# Patient Record
Sex: Female | Born: 1995
Health system: Southern US, Community
[De-identification: ages and names within clinical notes are randomized; demographics above are authoritative.]

## PROBLEM LIST (undated history)

## (undated) DIAGNOSIS — T7840XA Allergy, unspecified, initial encounter: Secondary | ICD-10-CM

## (undated) DIAGNOSIS — D682 Hereditary deficiency of other clotting factors: Secondary | ICD-10-CM

## (undated) DIAGNOSIS — D689 Coagulation defect, unspecified: Secondary | ICD-10-CM

## (undated) DIAGNOSIS — D6851 Activated protein C resistance: Secondary | ICD-10-CM

## (undated) HISTORY — PX: EYE SURGERY: SHX253

## (undated) HISTORY — DX: Allergy, unspecified, initial encounter: T78.40XA

## (undated) HISTORY — DX: Coagulation defect, unspecified: D68.9

---
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
## 2014-08-19 DIAGNOSIS — Z832 Family history of diseases of the blood and blood-forming organs and certain disorders involving the immune mechanism: Secondary | ICD-10-CM | POA: Insufficient documentation

## 2014-08-19 DIAGNOSIS — Z8249 Family history of ischemic heart disease and other diseases of the circulatory system: Secondary | ICD-10-CM | POA: Insufficient documentation

## 2014-09-14 DIAGNOSIS — D6851 Activated protein C resistance: Secondary | ICD-10-CM | POA: Insufficient documentation

## 2015-09-07 ENCOUNTER — Other Ambulatory Visit: Payer: Self-pay | Admitting: Pediatrics

## 2015-09-07 DIAGNOSIS — D179 Benign lipomatous neoplasm, unspecified: Secondary | ICD-10-CM

## 2015-09-09 ENCOUNTER — Other Ambulatory Visit: Payer: Self-pay

## 2015-09-14 ENCOUNTER — Ambulatory Visit
Admission: RE | Admit: 2015-09-14 | Discharge: 2015-09-14 | Disposition: A | Discharge status: Home / Self Care | Payer: BLUE CROSS/BLUE SHIELD | Source: Ambulatory Visit | Attending: Pediatrics | Admitting: Pediatrics

## 2015-09-14 DIAGNOSIS — D179 Benign lipomatous neoplasm, unspecified: Secondary | ICD-10-CM | POA: Diagnosis present

## 2015-09-14 DIAGNOSIS — D171 Benign lipomatous neoplasm of skin and subcutaneous tissue of trunk: Secondary | ICD-10-CM | POA: Insufficient documentation

## 2015-12-10 ENCOUNTER — Other Ambulatory Visit: Payer: Self-pay | Admitting: Pediatrics

## 2015-12-10 ENCOUNTER — Ambulatory Visit
Admission: RE | Admit: 2015-12-10 | Discharge: 2015-12-10 | Disposition: A | Discharge status: Home / Self Care | Payer: BLUE CROSS/BLUE SHIELD | Source: Ambulatory Visit | Attending: Pediatrics | Admitting: Pediatrics

## 2015-12-10 ENCOUNTER — Other Ambulatory Visit
Admission: RE | Admit: 2015-12-10 | Discharge: 2015-12-10 | Disposition: A | Discharge status: Home / Self Care | Payer: BLUE CROSS/BLUE SHIELD | Source: Ambulatory Visit | Attending: Pediatrics | Admitting: Pediatrics

## 2015-12-10 DIAGNOSIS — G8929 Other chronic pain: Secondary | ICD-10-CM | POA: Diagnosis not present

## 2015-12-10 DIAGNOSIS — M25561 Pain in right knee: Secondary | ICD-10-CM | POA: Diagnosis present

## 2015-12-10 LAB — PREGNANCY, URINE: Preg Test, Ur: NEGATIVE

## 2018-11-06 DIAGNOSIS — D689 Coagulation defect, unspecified: Secondary | ICD-10-CM | POA: Insufficient documentation

## 2020-02-05 ENCOUNTER — Ambulatory Visit: Payer: Self-pay | Attending: Internal Medicine

## 2020-02-05 DIAGNOSIS — Z23 Encounter for immunization: Secondary | ICD-10-CM

## 2020-02-05 NOTE — Progress Notes (Signed)
   Covid-19 Vaccination Clinic  Name:  [name]    MRN: [id number] DOB: Feb 09, 1996  02/05/2020  [name] was observed post Covid-19 immunization for 15 minutes without incident. She was provided with Vaccine Information Sheet and instruction to access the V-Safe system.   [name] was instructed to call 911 with any severe reactions post vaccine: Marland Kitchen Difficulty breathing  . Swelling of face and throat  . A fast heartbeat  . A bad rash all over body  . Dizziness and weakness   Immunizations Administered    Name Date Dose VIS Date Route   Moderna COVID-19 Vaccine 02/05/2020  9:50 AM 0.5 mL 10/20/2019 Intramuscular   Manufacturer: Moderna   Lot: [id number]   [id number]

## 2020-03-08 ENCOUNTER — Ambulatory Visit: Payer: Self-pay | Attending: Internal Medicine

## 2020-03-08 DIAGNOSIS — Z23 Encounter for immunization: Secondary | ICD-10-CM

## 2020-03-08 NOTE — Progress Notes (Signed)
   Covid-19 Vaccination Clinic  Name:  Monica Singleton    MRN: [id number] DOB: [birth year]  03/08/2020  Monica Singleton was observed post Covid-19 immunization for 15 minutes .  During the observation period, she experienced an adverse reaction with the following symptoms:  felt flushed, "like I'm about to pass out", pale colored..  Assessment : Time of assessment 0920. Alert and oriented, Anxious, Diaphoretic and Clammy. After sitting for 5 minutes of a 15 minute observation, patient reported feeling like she will pass out. She was flushed, felt hot, color pale, clammy. She was placed on the floor and she says she was starting to feel better. She did not pass out. 0925: BP 108/68 lying on the floor, P 52, 97% oxygen saturation RA. She reported feeling better. [id number]: She was assisted to the wheelchair and taken to the triage area. She was assisted to the stretcher. 0931: BP 116/65, P 51  Actions taken:  [id number]: Vitals sign taken  VAERS form obtained and completed by Janan Ridge, RN. Standing BP 131/83 P 64 R 16, Oxgen 100% RA, ambulated around with no symptoms.  There were no vitals filed for this visit.  Medications administered: No medication administered.  Disposition: Reports no further symptoms of adverse reaction after observation for 25 minutes. Discharged home. Instructed to call 911 for trouble breathing, rapid heart rate, dizziness, swelling of tongue or throat. The Patient was provided with Vaccine Information Sheet and instruction to access the V-Safe system.    Immunizations Administered    Name Date Dose VIS Date Route   Moderna COVID-19 Vaccine 03/08/2020  9:16 AM 0.5 mL 10/2019 Intramuscular   Manufacturer: Moderna   Lot: [id number]   [id number]

## 2020-03-10 ENCOUNTER — Telehealth: Payer: Self-pay | Admitting: *Deleted

## 2020-03-10 NOTE — Telephone Encounter (Signed)
Called and spoke with the patient regarding her second COVID vaccine that she received on 03/08/2020 and she stated that evening she was running a fever and had body aches, she stated other than that she felt fine and had no other issues. Advised to patient that it did not seem like any of her symptoms represented an allergic reaction and that she should be fine to receive future COVID boosters and to please call our office with any questions. Patient verbalized understanding.

## 2020-03-19 HISTORY — PX: WISDOM TOOTH EXTRACTION: SHX21

## 2020-07-26 ENCOUNTER — Ambulatory Visit
Admission: EM | Admit: 2020-07-26 | Discharge: 2020-07-26 | Disposition: A | Discharge status: Home / Self Care | Payer: BC Managed Care – PPO | Attending: Emergency Medicine | Admitting: Emergency Medicine

## 2020-07-26 DIAGNOSIS — R21 Rash and other nonspecific skin eruption: Secondary | ICD-10-CM

## 2020-07-26 HISTORY — DX: Hereditary deficiency of other clotting factors: D68.2

## 2020-07-26 HISTORY — DX: Activated protein C resistance: D68.51

## 2020-07-26 MED ORDER — DOXYCYCLINE HYCLATE 100 MG PO CAPS: 100.0000 mg | ORAL_CAPSULE | Freq: Two times a day (BID) | ORAL | 0 refills | Status: DC

## 2020-07-26 MED ORDER — PREDNISONE 10 MG (21) PO TBPK: ORAL_TABLET | Freq: Every day | ORAL | 0 refills | Status: DC

## 2020-07-26 NOTE — Discharge Instructions (Addendum)
Your lab work for Lyme disease and [facility] Spotted Fever is pending.    Take the doxycycline and prednisone as directed.  Take Benadryl as directed; do not drive, operate machinery, or drink alcohol taking Benadryl as it may cause drowsiness.    Follow up with your primary care provider if your symptoms are not improving.

## 2020-07-26 NOTE — ED Provider Notes (Signed)
Monica Singleton    CSN: [id number] Arrival date & time: 07/26/20  1621      History   Chief Complaint Chief Complaint  Patient presents with  . Rash  . Pruritis    HPI Monica Singleton is a 24 y.o. female.   Patient presents with rash on lower extremities x2.5 weeks.  She is concerned that she may have had a tick bite although she has not seen one.  She denies fever, chills, sore throat, cough, SOB, vomiting, diarrhea, or other symptoms.  Treatment attempted at home itch medication.  The history is provided by the patient.    Past Medical History:  Diagnosis Date  . 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 Factor V Leiden ([place])     There are no problems to display for this patient.   Past Surgical History:  Procedure Laterality Date  . WISDOM TOOTH EXTRACTION  03/2020    OB History   No obstetric history on file.      Home Medications    Prior to Admission medications   Medication Sig Start Date End Date Taking? Authorizing Provider  doxycycline (VIBRAMYCIN) 100 MG capsule Take 1 capsule (100 mg total) by mouth 2 (two) times daily. 07/26/20   Sharion Balloon, NP  predniSONE (STERAPRED UNI-PAK 21 TAB) 10 MG (21) TBPK tablet Take by mouth daily. As directed 07/26/20   Sharion Balloon, NP    Family History Family History  Problem Relation Age of Onset  . Factor V Leiden deficiency Father     Social History Social History   Tobacco Use  . Smoking status: Never Smoker  . Smokeless tobacco: Never Used  Vaping Use  . Vaping Use: Never used  Substance Use Topics  . Alcohol use: Yes    Comment: occasionally  . Drug use: Not on file     Allergies   Nickel   Review of Systems Review of Systems  Constitutional: Negative for chills and fever.  HENT: Negative for ear pain and sore throat.   Eyes: Negative for pain and visual disturbance.  Respiratory: Negative for cough and shortness of breath.   Cardiovascular: Negative for chest pain and palpitations.   Gastrointestinal: Negative for abdominal pain and vomiting.  Genitourinary: Negative for dysuria and hematuria.  Musculoskeletal: Negative for arthralgias and back pain.  Skin: Positive for rash. Negative for color change.  Neurological: Negative for seizures, syncope, weakness and numbness.  All other systems reviewed and are negative.    Physical Exam Triage Vital Signs ED Triage Vitals  Enc Vitals Group     BP 07/26/20 1711 (!) 149/90     Pulse Rate 07/26/20 1711 90     Resp 07/26/20 1711 16     Temp 07/26/20 1711 98.1 F (36.7 C)     Temp src --      SpO2 07/26/20 1711 99 %     Weight --      Height --      Head Circumference --      Peak Flow --      Pain Score 07/26/20 1706 4     Pain Loc --      Pain Edu? --      Excl. in [place]? --    No data found.  Updated Vital Signs BP (!) 149/90   Pulse 90   Temp 98.1 F (36.7 C)   Resp 16   SpO2 99%   Visual Acuity Right Eye Distance:   Left  Eye Distance:   Bilateral Distance:    Right Eye Near:   Left Eye Near:    Bilateral Near:     Physical Exam Vitals and nursing note reviewed.  Constitutional:      General: She is not in acute distress.    Appearance: She is well-developed. She is not ill-appearing.  HENT:     Head: Normocephalic and atraumatic.     Mouth/Throat:     Mouth: Mucous membranes are moist.  Eyes:     Conjunctiva/sclera: Conjunctivae normal.  Cardiovascular:     Rate and Rhythm: Normal rate and regular rhythm.     Heart sounds: No murmur heard.   Pulmonary:     Effort: Pulmonary effort is normal. No respiratory distress.     Breath sounds: Normal breath sounds.  Abdominal:     Palpations: Abdomen is soft.     Tenderness: There is no abdominal tenderness.  Musculoskeletal:        General: Normal range of motion.     Cervical back: Neck supple.  Skin:    General: Skin is warm and dry.     Findings: Rash present.     Comments: Rash on LE.  See pictures for details.    Neurological:      General: No focal deficit present.     Mental Status: She is alert and oriented to person, place, and time.     Gait: Gait normal.  Psychiatric:        Mood and Affect: Mood normal.        Behavior: Behavior normal.            UC Treatments / Results  Labs (all labs ordered are listed, but only abnormal results are displayed) Labs Reviewed  B. BURGDORFI ANTIBODIES  ROCKY MTN SPOTTED FVR ABS PNL(IGG+IGM)    EKG   Radiology No results found.  Procedures Procedures (including critical care time)  Medications Ordered in UC Medications - No data to display  Initial Impression / Assessment and Plan / UC Course  I have reviewed the triage vital signs and the nursing notes.  Pertinent labs & imaging results that were available during my care of the patient were reviewed by me and considered in my medical decision making (see chart for details).   Rash on LE.  Lyme and RMSF pending.  Treating with doxycycline, prednisone, and Benadryl.  Precautions for drowsiness with Benadryl discussed with patient.  Instructed her to follow-up primary care provider if her symptoms are not improving.  Patient agrees to plan of care.   Final Clinical Impressions(s) / UC Diagnoses   Final diagnoses:  Rash and nonspecific skin eruption     Discharge Instructions     Your lab work for Lyme disease and [facility] Spotted Fever is pending.    Take the doxycycline and prednisone as directed.  Take Benadryl as directed; do not drive, operate machinery, or drink alcohol taking Benadryl as it may cause drowsiness.    Follow up with your primary care provider if your symptoms are not improving.       ED Prescriptions    Medication Sig Dispense Auth. Provider   doxycycline (VIBRAMYCIN) 100 MG capsule Take 1 capsule (100 mg total) by mouth 2 (two) times daily. 20 capsule Sharion Balloon, NP   predniSONE (STERAPRED UNI-PAK 21 TAB) 10 MG (21) TBPK tablet Take by mouth daily. As  directed 21 tablet Sharion Balloon, NP     PDMP not reviewed  this encounter.   Sharion Balloon, NP 07/26/20 1750

## 2020-07-26 NOTE — ED Triage Notes (Signed)
Patient reports rash on bilateral lower extremities that is beginning to spread. Reports it has been 2.5 weeks. Also concerned one of the areas may be a tick bite.

## 2020-07-27 ENCOUNTER — Ambulatory Visit: Payer: Self-pay

## 2020-07-28 LAB — ROCKY MTN SPOTTED FVR ABS PNL(IGG+IGM)
RMSF IgG: NEGATIVE
RMSF IgM: 1.52 index — ABNORMAL HIGH (ref 0.00–0.89)

## 2020-07-28 LAB — B. BURGDORFI ANTIBODIES: Lyme IgG/IgM Ab: <0.91 {ISR} (ref 0.00–0.90)

## 2021-01-18 ENCOUNTER — Ambulatory Visit: Payer: BC Managed Care – PPO | Admitting: Adult Health

## 2021-02-01 ENCOUNTER — Other Ambulatory Visit: Payer: Self-pay

## 2021-02-01 ENCOUNTER — Ambulatory Visit (INDEPENDENT_AMBULATORY_CARE_PROVIDER_SITE_OTHER): Payer: BC Managed Care – PPO | Admitting: Adult Health

## 2021-02-01 ENCOUNTER — Encounter: Payer: Self-pay | Admitting: Adult Health

## 2021-02-01 VITALS — BP 126/68 | HR 56 | Temp 97.7°F | Resp 15 | Ht 65.0 in | Wt 141.4 lb

## 2021-02-01 DIAGNOSIS — Z7901 Long term (current) use of anticoagulants: Secondary | ICD-10-CM | POA: Insufficient documentation

## 2021-02-01 DIAGNOSIS — D6859 Other primary thrombophilia: Secondary | ICD-10-CM

## 2021-02-01 DIAGNOSIS — D682 Hereditary deficiency of other clotting factors: Secondary | ICD-10-CM | POA: Insufficient documentation

## 2021-02-01 DIAGNOSIS — D6852 Prothrombin gene mutation: Secondary | ICD-10-CM | POA: Diagnosis not present

## 2021-02-01 DIAGNOSIS — Z1389 Encounter for screening for other disorder: Secondary | ICD-10-CM

## 2021-02-01 DIAGNOSIS — K582 Mixed irritable bowel syndrome: Secondary | ICD-10-CM | POA: Insufficient documentation

## 2021-02-01 DIAGNOSIS — D6851 Activated protein C resistance: Secondary | ICD-10-CM

## 2021-02-01 LAB — POCT URINALYSIS DIPSTICK
Bilirubin, UA: NEGATIVE
Blood, UA: NEGATIVE
Glucose, UA: NEGATIVE
Ketones, UA: NEGATIVE
Leukocytes, UA: NEGATIVE
Nitrite, UA: NEGATIVE
Protein, UA: NEGATIVE
Spec Grav, UA: <=1.005 — AB (ref 1.010–1.025)
Urobilinogen, UA: 0.2 E.U./dL
pH, UA: 7 (ref 5.0–8.0)

## 2021-02-01 NOTE — Patient Instructions (Addendum)
Debrox over the counter use for 2 weeks.,  Health Maintenance, Female Adopting a healthy lifestyle and getting preventive care are important in promoting health and wellness. Ask your health care provider about:  The right schedule for you to have regular tests and exams.  Things you can do on your own to prevent diseases and keep yourself healthy. What should I know about diet, weight, and exercise? Eat a healthy diet  Eat a diet that includes plenty of vegetables, fruits, low-fat dairy products, and lean protein.  Do not eat a lot of foods that are high in solid fats, added sugars, or sodium.   Maintain a healthy weight Body mass index (BMI) is used to identify weight problems. It estimates body fat based on height and weight. Your health care provider can help determine your BMI and help you achieve or maintain a healthy weight. Get regular exercise Get regular exercise. This is one of the most important things you can do for your health. Most adults should:  Exercise for at least 150 minutes each week. The exercise should increase your heart rate and make you sweat (moderate-intensity exercise).  Do strengthening exercises at least twice a week. This is in addition to the moderate-intensity exercise.  Spend less time sitting. Even light physical activity can be beneficial. Watch cholesterol and blood lipids Have your blood tested for lipids and cholesterol at 26 years of age, then have this test every 5 years. Have your cholesterol levels checked more often if:  Your lipid or cholesterol levels are high.  You are older than 26 years of age.  You are at high risk for heart disease. What should I know about cancer screening? Depending on your health history and family history, you may need to have cancer screening at various ages. This may include screening for:  Breast cancer.  Cervical cancer.  Colorectal cancer.  Skin cancer.  Lung cancer. What should I know about  heart disease, diabetes, and high blood pressure? Blood pressure and heart disease  High blood pressure causes heart disease and increases the risk of stroke. This is more likely to develop in people who have high blood pressure readings, are of African descent, or are overweight.  Have your blood pressure checked: ? Every 3-5 years if you are 78-56 years of age. ? Every year if you are 42 years old or older. Diabetes Have regular diabetes screenings. This checks your fasting blood sugar level. Have the screening done:  Once every three years after age 98 if you are at a normal weight and have a low risk for diabetes.  More often and at a younger age if you are overweight or have a high risk for diabetes. What should I know about preventing infection? Hepatitis B If you have a higher risk for hepatitis B, you should be screened for this virus. Talk with your health care provider to find out if you are at risk for hepatitis B infection. Hepatitis C Testing is recommended for:  Everyone born from [birth year] through 1966.  Anyone with known risk factors for hepatitis C. Sexually transmitted infections (STIs)  Get screened for STIs, including gonorrhea and chlamydia, if: ? You are sexually active and are younger than 26 years of age. ? You are older than 26 years of age and your health care provider tells you that you are at risk for this type of infection. ? Your sexual activity has changed since you were last screened, and you are at increased risk  for chlamydia or gonorrhea. Ask your health care provider if you are at risk.  Ask your health care provider about whether you are at high risk for HIV. Your health care provider may recommend a prescription medicine to help prevent HIV infection. If you choose to take medicine to prevent HIV, you should first get tested for HIV. You should then be tested every 3 months for as long as you are taking the medicine. Pregnancy  If you are about to  stop having your period (premenopausal) and you may become pregnant, seek counseling before you get pregnant.  Take 400 to 800 micrograms (mcg) of folic acid every day if you become pregnant.  Ask for birth control (contraception) if you want to prevent pregnancy. Osteoporosis and menopause Osteoporosis is a disease in which the bones lose minerals and strength with aging. This can result in bone fractures. If you are 92 years old or older, or if you are at risk for osteoporosis and fractures, ask your health care provider if you should:  Be screened for bone loss.  Take a calcium or vitamin D supplement to lower your risk of fractures.  Be given hormone replacement therapy (HRT) to treat symptoms of menopause. Follow these instructions at home: Lifestyle  Do not use any products that contain nicotine or tobacco, such as cigarettes, e-cigarettes, and chewing tobacco. If you need help quitting, ask your health care provider.  Do not use street drugs.  Do not share needles.  Ask your health care provider for help if you need support or information about quitting drugs. Alcohol use  Do not drink alcohol if: ? Your health care provider tells you not to drink. ? You are pregnant, may be pregnant, or are planning to become pregnant.  If you drink alcohol: ? Limit how much you use to 0-1 drink a day. ? Limit intake if you are breastfeeding.  Be aware of how much alcohol is in your drink. In the [place] one drink equals one 12 oz bottle of beer (355 mL), one 5 oz glass of wine (148 mL), or one 1 oz glass of hard liquor (44 mL). General instructions  Schedule regular health, dental, and eye exams.  Stay current with your vaccines.  Tell your health care provider if: ? You often feel depressed. ? You have ever been abused or do not feel safe at home. Summary  Adopting a healthy lifestyle and getting preventive care are important in promoting health and wellness.  Follow your  health care provider's instructions about healthy diet, exercising, and getting tested or screened for diseases.  Follow your health care provider's instructions on monitoring your cholesterol and blood pressure. This information is not intended to replace advice given to you by your health care provider. Make sure you discuss any questions you have with your health care provider. Document Revised: 10/29/2018 Document Reviewed: 10/29/2018 Elsevier Patient Education  2021 [place] [employer].     Psyllium granules or powder for solution What is this medicine? PSYLLIUM (SIL i yum) is a bulk-forming fiber laxative. This medicine is used to treat constipation. Increasing fiber in the diet may also help lower cholesterol and promote heart health for some people. This medicine may be used for other purposes; ask your health care provider or pharmacist if you have questions. COMMON BRAND NAME(S): Fiber Therapy, GenFiber, Geri-Mucil, Hydrocil, Konsyl, Metamucil, Metamucil MultiHealth, Mucilin, Natural Fiber Therapy, Reguloid What should I tell my health care provider before I take this medicine? They need to  know if you have any of these conditions:  blockage in your bowel  difficulty swallowing  inflammatory bowel disease  phenylketonuria  stomach or intestine problems  sudden change in bowel habits lasting more than 2 weeks  an unusual or allergic reaction to psyllium, other medicines, dyes, or preservatives  pregnant or trying or get pregnant  breast-feeding How should I use this medicine? Mix this medicine into a full glass (240 mL) of water or other cool drink. Take this medicine by mouth. Follow the directions on the package labeling, or take as directed by your health care professional. Take your medicine at regular intervals. Do not take your medicine more often than directed. Talk to your pediatrician regarding the use of this medicine in children. While this drug may be prescribed  for children as young as 24 years old for selected conditions, precautions do apply. Overdosage: If you think you have taken too much of this medicine contact a poison control center or emergency room at once. NOTE: This medicine is only for you. Do not share this medicine with others. What if I miss a dose? If you miss a dose, take it as soon as you can. If it is almost time for your next dose, take only that dose. Do not take double or extra doses. What may interact with this medicine? Interactions are not expected. Take this product at least 2 hours before or after other medicines. This list may not describe all possible interactions. Give your health care provider a list of all the medicines, herbs, non-prescription drugs, or dietary supplements you use. Also tell them if you smoke, drink alcohol, or use illegal drugs. Some items may interact with your medicine. What should I watch for while using this medicine? Check with your doctor or health care professional if your symptoms do not start to get better or if they get worse. Stop using this medicine and contact your doctor or health care professional if you have rectal bleeding or if you have to treat your constipation for more than 1 week. These could be signs of a more serious condition. Drink several glasses of water a day while you are taking this medicine. This will help to relieve constipation and prevent dehydration. What side effects may I notice from receiving this medicine? Side effects that you should report to your doctor or health care professional as soon as possible:  allergic reactions like skin rash, itching or hives, swelling of the face, lips, or tongue  breathing problems  chest pain  nausea, vomiting  rectal bleeding  trouble swallowing Side effects that usually do not require medical attention (report to your doctor or health care professional if they continue or are bothersome):  bloating  gas  stomach  cramps This list may not describe all possible side effects. Call your doctor for medical advice about side effects. You may report side effects to FDA at 1-800-FDA-1088. Where should I keep my medicine? Keep out of the reach of children. Store at room temperature between 15 and 30 degrees C (59 and 86 degrees F). Protect from moisture. Throw away any unused medicine after the expiration date. NOTE: This sheet is a summary. It may not cover all possible information. If you have questions about this medicine, talk to your doctor, pharmacist, or health care provider.  2021 Elsevier/Gold Standard (2018-04-01 15:41:08)   Earwax Buildup, Adult The ears produce a substance called earwax that helps keep bacteria out of the ear and protects the skin in  the ear canal. Occasionally, earwax can build up in the ear and cause discomfort or hearing loss. What are the causes? This condition is caused by a buildup of earwax. Ear canals are self-cleaning. Ear wax is made in the outer part of the ear canal and generally falls out in small amounts over time. When the self-cleaning mechanism is not working, earwax builds up and can cause decreased hearing and discomfort. Attempting to clean ears with cotton swabs can push the earwax deep into the ear canal and cause decreased hearing and pain. What increases the risk? This condition is more likely to develop in people who:  Clean their ears often with cotton swabs.  Pick at their ears.  Use earplugs or in-ear headphones often, or wear hearing aids. The following factors may also make you more likely to develop this condition:  Being female.  Being of older age.  Naturally producing more earwax.  Having narrow ear canals.  Having earwax that is overly thick or sticky.  Having excess hair in the ear canal.  Having eczema.  Being dehydrated. What are the signs or symptoms? Symptoms of this condition include:  Reduced or muffled hearing.  A  feeling of fullness in the ear or feeling that the ear is plugged.  Fluid coming from the ear.  Ear pain or an itchy ear.  Ringing in the ear.  Coughing.  Balance problems.  An obvious piece of earwax that can be seen inside the ear canal. How is this diagnosed? This condition may be diagnosed based on:  Your symptoms.  Your medical history.  An ear exam. During the exam, your health care provider will look into your ear with an instrument called an otoscope. You may have tests, including a hearing test. How is this treated? This condition may be treated by:  Using ear drops to soften the earwax.  Having the earwax removed by a health care provider. The health care provider may: ? Flush the ear with water. ? Use an instrument that has a loop on the end (curette). ? Use a suction device.  Having surgery to remove the wax buildup. This may be done in severe cases. Follow these instructions at home:  Take over-the-counter and prescription medicines only as told by your health care provider.  Do not put any objects, including cotton swabs, into your ear. You can clean the opening of your ear canal with a washcloth or facial tissue.  Follow instructions from your health care provider about cleaning your ears. Do not overclean your ears.  Drink enough fluid to keep your urine pale yellow. This will help to thin the earwax.  Keep all follow-up visits as told. If earwax builds up in your ears often or if you use hearing aids, consider seeing your health care provider for routine, preventive ear cleanings. Ask your health care provider how often you should schedule your cleanings.  If you have hearing aids, clean them according to instructions from the manufacturer and your health care provider.   Contact a health care provider if:  You have ear pain.  You develop a fever.  You have pus or other fluid coming from your ear.  You have hearing loss.  You have ringing in  your ears that does not go away.  You feel like the room is spinning (vertigo).  Your symptoms do not improve with treatment. Get help right away if:  You have bleeding from the affected ear.  You have severe ear pain.  Summary  Earwax can build up in the ear and cause discomfort or hearing loss.  The most common symptoms of this condition include reduced or muffled hearing, a feeling of fullness in the ear, or feeling that the ear is plugged.  This condition may be diagnosed based on your symptoms, your medical history, and an ear exam.  This condition may be treated by using ear drops to soften the earwax or by having the earwax removed by a health care provider.  Do not put any objects, including cotton swabs, into your ear. You can clean the opening of your ear canal with a washcloth or facial tissue. This information is not intended to replace advice given to you by your health care provider. Make sure you discuss any questions you have with your health care provider. Document Revised: 02/23/2020 Document Reviewed: 02/23/2020 Elsevier Patient Education  2021 [place].  Carbamide Peroxide ear solution What is this medicine? CARBAMIDE PEROXIDE (CAR bah mide per OX ide) is used to soften and help remove ear wax. This medicine may be used for other purposes; ask your health care provider or pharmacist if you have questions. COMMON BRAND NAME(S): Auro Ear, Auro Earache Relief, Debrox, Ear Drops, Ear Wax Removal, Ear Wax Remover, Earwax Treatment, Murine, Thera-Ear What should I tell my health care provider before I take this medicine? They need to know if you have any of these conditions:  dizziness  ear discharge  ear pain, irritation or rash  infection  perforated eardrum (hole in eardrum)  an unusual or allergic reaction to carbamide peroxide, glycerin, hydrogen peroxide, other medicines, foods, dyes, or preservatives  pregnant or trying to get  pregnant  breast-feeding How should I use this medicine? This medicine is only for use in the outer ear canal. Follow the directions carefully. Wash hands before and after use. The solution may be warmed by holding the bottle in the hand for 1 to 2 minutes. Lie with the affected ear facing upward. Place the proper number of drops into the ear canal. After the drops are instilled, remain lying with the affected ear upward for 5 minutes to help the drops stay in the ear canal. A cotton ball may be gently inserted at the ear opening for no longer than 5 to 10 minutes to ensure retention. Repeat, if necessary, for the opposite ear. Do not touch the tip of the dropper to the ear, fingertips, or other surface. Do not rinse the dropper after use. Keep container tightly closed. Talk to your pediatrician regarding the use of this medicine in children. While this drug may be used in children as young as 12 years for selected conditions, precautions do apply. Overdosage: If you think you have taken too much of this medicine contact a poison control center or emergency room at once. NOTE: This medicine is only for you. Do not share this medicine with others. What if I miss a dose? If you miss a dose, use it as soon as you can. If it is almost time for your next dose, use only that dose. Do not use double or extra doses. What may interact with this medicine? Interactions are not expected. Do not use any other ear products without asking your doctor or health care professional. This list may not describe all possible interactions. Give your health care provider a list of all the medicines, herbs, non-prescription drugs, or dietary supplements you use. Also tell them if you smoke, drink alcohol, or use illegal drugs. Some  items may interact with your medicine. What should I watch for while using this medicine? This medicine is not for long-term use. Do not use for more than 4 days without checking with your health  care professional. Contact your doctor or health care professional if your condition does not start to get better within a few days or if you notice burning, redness, itching or swelling. What side effects may I notice from receiving this medicine? Side effects that you should report to your doctor or health care professional as soon as possible:  allergic reactions like skin rash, itching or hives, swelling of the face, lips, or tongue  burning, itching, and redness  worsening ear pain  rash Side effects that usually do not require medical attention (report to your doctor or health care professional if they continue or are bothersome):  abnormal sensation while putting the drops in the ear  temporary reduction in hearing (but not complete loss of hearing) This list may not describe all possible side effects. Call your doctor for medical advice about side effects. You may report side effects to FDA at 1-800-FDA-1088. Where should I keep my medicine? Keep out of the reach of children. Store at room temperature between 15 and 30 degrees C (59 and 86 degrees F) in a tight, light-resistant container. Keep bottle away from excessive heat and direct sunlight. Throw away any unused medicine after the expiration date. NOTE: This sheet is a summary. It may not cover all possible information. If you have questions about this medicine, talk to your doctor, pharmacist, or health care provider.  2021 Elsevier/Gold Standard (2008-02-17 14:00:02)

## 2021-02-01 NOTE — Progress Notes (Signed)
New patient visit   Patient: Monica Singleton   DOB: 1996/08/28   24 y.o. Female  MRN: [id number] Visit Date: 02/01/2021  Today's healthcare provider: Marcille Buffy, FNP   Chief Complaint  Patient presents with  . New Patient (Initial Visit)   Subjective    Monica Singleton is a 25 y.o. female who presents today as a new patient to establish care.  HPI  Patient presents in office today to establish care, she states that she feels well today and has no concerns to address.   Patient states that she follows a general diet, she is staying active by exercising 4x a week and reports that her sleep habits are fairly well.   Patient states that she sees a gynecologist in [facility] and reports that last papsmear was within the past 3years and was normal.  She has constipation and diarrhea alternating. She goes from constipation having to use glycerin suppository. She also has diarrhea where she needs to get to the bathroom very quickly.  She is on a high fiber diet.  Denies bleeding, denies any dark stools.  Occasionally painful and bloating at times.  She has taken fiber supplements,   No LMP recorded. (Menstrual status: IUD).- She sees [place].   Past Medical History:  Diagnosis Date  . Allergy   . Clotting disorder ([place])   . 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 Factor V Leiden [facility])    Past Surgical History:  Procedure Laterality Date  . EYE SURGERY    . WISDOM TOOTH EXTRACTION  03/2020   Family Status  Relation Name Status  . Mother  Alive  . Father  Alive  . PGM  (Not Specified)   Family History  Problem Relation Age of Onset  . Depression Mother   . Factor V Leiden deficiency Father   . Skin cancer Father   . Arthritis Paternal Grandmother   . Clotting disorder Paternal Grandmother   . Cataracts Paternal Grandmother    Social History   Socioeconomic History  . Marital status: Single    Spouse name: Not on file  . Number of  children: Not on file  . Years of education: Not on file  . Highest education level: Not on file  Occupational History  . Not on file  Tobacco Use  . Smoking status: Current Every Day Smoker  . Smokeless tobacco: Never Used  Vaping Use  . Vaping Use: Never used  Substance and Sexual Activity  . Alcohol use: Yes    Alcohol/week: 6.0 standard drinks    Types: 1 Glasses of wine, 5 Cans of beer per week    Comment: occasionally  . Drug use: Never  . Sexual activity: Yes    Birth control/protection: I.U.D.  Other Topics Concern  . Not on file  Social History Narrative  . Not on file   Social Determinants of Health   Financial Resource Strain: Not on file  Food Insecurity: Not on file  Transportation Needs: Not on file  Physical Activity: Not on file  Stress: Not on file  Social Connections: Not on file   Outpatient Medications Prior to Visit  Medication Sig Note  . levonorgestrel (MIRENA, 52 MG,) 20 MCG/24HR IUD 1 each by Intrauterine route once.   . rivaroxaban (XARELTO) 10 MG TABS tablet Take one tablet the day before travel, one tablet the day of travel, and one tablet the day after travel. 02/01/2021: Patient reports PRN  . [DISCONTINUED]  doxycycline (VIBRAMYCIN) 100 MG capsule Take 1 capsule (100 mg total) by mouth 2 (two) times daily.   . [DISCONTINUED] predniSONE (STERAPRED UNI-PAK 21 TAB) 10 MG (21) TBPK tablet Take by mouth daily. As directed    No facility-administered medications prior to visit.   Allergies  Allergen Reactions  . Nickel Rash    Immunization History  Administered Date(s) Administered  . Moderna Sars-Covid-2 Vaccination 02/05/2020, 03/08/2020    Health Maintenance  Topic Date Due  . PAP-Cervical Cytology Screening  Never done  . COVID-19 Vaccine (3 - Booster for Moderna series) 02/17/2021 (Originally 09/07/2020)  . INFLUENZA VACCINE  03/20/2021 (Originally 06/19/2020)  . HPV VACCINES (1 - 2-dose series) 02/01/2022 (Originally 09/22/2006)  .  TETANUS/TDAP  02/01/2022 (Originally 09/22/2014)  . Hepatitis C Screening  02/01/2022 (Originally 1996/05/21)  . HIV Screening  02/01/2022 (Originally 09/22/2010)  . PAP SMEAR-Modifier  02/02/2024    Patient Care Team: Doreen Beam, FNP as PCP - General (Family Medicine)  Review of Systems  All other systems reviewed and are negative.     Objective    BP 126/68   Pulse (!) 56   Temp 97.7 F (36.5 C) (Oral)   Resp 15   Ht 5' 5"  (1.651 m)   Wt 141 lb 6.4 oz (64.1 kg)   SpO2 100%   BMI 23.53 kg/m  Physical Exam Vitals reviewed.  Constitutional:      General: She is not in acute distress.    Appearance: She is well-developed. She is not diaphoretic.     Interventions: She is not intubated. HENT:     Head: Normocephalic and atraumatic.     Right Ear: External ear normal.     Left Ear: External ear normal.     Nose: Nose normal.     Mouth/Throat:     Pharynx: No oropharyngeal exudate.  Eyes:     General: Lids are normal. No scleral icterus.       Right eye: No discharge.        Left eye: No discharge.     Conjunctiva/sclera: Conjunctivae normal.     Right eye: Right conjunctiva is not injected. No exudate or hemorrhage.    Left eye: Left conjunctiva is not injected. No exudate or hemorrhage.    Pupils: Pupils are equal, round, and reactive to light.  Neck:     Thyroid: No thyroid mass or thyromegaly.     Vascular: Normal carotid pulses. No carotid bruit, hepatojugular reflux or JVD.     Trachea: Trachea and phonation normal. No tracheal tenderness or tracheal deviation.     Meningeal: Brudzinski's sign and Kernig's sign absent.  Cardiovascular:     Rate and Rhythm: Normal rate and regular rhythm.     Pulses: Normal pulses.          Radial pulses are 2+ on the right side and 2+ on the left side.       Dorsalis pedis pulses are 2+ on the right side and 2+ on the left side.       Posterior tibial pulses are 2+ on the right side and 2+ on the left side.     Heart  sounds: Normal heart sounds, S1 normal and S2 normal. Heart sounds not distant. No murmur heard. No friction rub. No gallop.   Pulmonary:     Effort: Pulmonary effort is normal. No tachypnea, bradypnea, accessory muscle usage or respiratory distress. She is not intubated.     Breath sounds: Normal breath sounds.  No stridor. No wheezing or rales.  Chest:     Chest wall: No tenderness.  Breasts:     Right: No supraclavicular adenopathy.     Left: No supraclavicular adenopathy.    Abdominal:     General: Bowel sounds are normal. There is no distension or abdominal bruit.     Palpations: Abdomen is soft. There is no shifting dullness, fluid wave, hepatomegaly, splenomegaly, mass or pulsatile mass.     Tenderness: There is no abdominal tenderness. There is no guarding or rebound.     Hernia: No hernia is present.  Musculoskeletal:        General: No tenderness or deformity. Normal range of motion.     Cervical back: Full passive range of motion without pain, normal range of motion and neck supple. No edema, erythema or rigidity. No spinous process tenderness or muscular tenderness. Normal range of motion.  Lymphadenopathy:     Head:     Right side of head: No submental, submandibular, tonsillar, preauricular, posterior auricular or occipital adenopathy.     Left side of head: No submental, submandibular, tonsillar, preauricular, posterior auricular or occipital adenopathy.     Cervical: No cervical adenopathy.     Right cervical: No superficial, deep or posterior cervical adenopathy.    Left cervical: No superficial, deep or posterior cervical adenopathy.     Upper Body:     Right upper body: No supraclavicular or pectoral adenopathy.     Left upper body: No supraclavicular or pectoral adenopathy.  Skin:    General: Skin is warm and dry.     Coloration: Skin is not pale.     Findings: No abrasion, bruising, burn, ecchymosis, erythema, lesion, petechiae or rash.     Nails: There is no  clubbing.  Neurological:     Mental Status: She is alert and oriented to person, place, and time.     GCS: GCS eye subscore is 4. GCS verbal subscore is 5. GCS motor subscore is 6.     Cranial Nerves: No cranial nerve deficit.     Sensory: No sensory deficit.     Motor: No tremor, atrophy, abnormal muscle tone or seizure activity.     Coordination: Coordination normal.     Gait: Gait normal.     Deep Tendon Reflexes: Reflexes are normal and symmetric. Reflexes normal. Babinski sign absent on the right side. Babinski sign absent on the left side.     Reflex Scores:      Tricep reflexes are 2+ on the right side and 2+ on the left side.      Bicep reflexes are 2+ on the right side and 2+ on the left side.      Brachioradialis reflexes are 2+ on the right side and 2+ on the left side.      Patellar reflexes are 2+ on the right side and 2+ on the left side.      Achilles reflexes are 2+ on the right side and 2+ on the left side. Psychiatric:        Speech: Speech normal.        Behavior: Behavior normal.        Thought Content: Thought content normal.        Judgment: Judgment normal.      Depression Screen PHQ 2/9 Scores 02/01/2021  PHQ - 2 Score 0  PHQ- 9 Score 2   Results for orders placed or performed in visit on 02/01/21  CBC with Differential/Platelet  Result Value Ref Range   WBC 5.5 3.4 - 10.8 x10E3/uL   RBC 4.46 3.77 - 5.28 x10E6/uL   Hemoglobin 13.3 11.1 - 15.9 g/dL   Hematocrit 40.9 34.0 - 46.6 %   MCV 92 79 - 97 fL   MCH 29.8 26.6 - 33.0 pg   MCHC 32.5 31.5 - 35.7 g/dL   RDW 11.8 11.7 - 15.4 %   Platelets 306 150 - 450 x10E3/uL   Neutrophils 55 Not Estab. %   Lymphs 33 Not Estab. %   Monocytes 8 Not Estab. %   Eos 3 Not Estab. %   Basos 1 Not Estab. %   Neutrophils Absolute 3.0 1.4 - 7.0 x10E3/uL   Lymphocytes Absolute 1.8 0.7 - 3.1 x10E3/uL   Monocytes Absolute 0.4 0.1 - 0.9 x10E3/uL   EOS (ABSOLUTE) 0.2 0.0 - 0.4 x10E3/uL   Basophils Absolute 0.1 0.0 - 0.2  x10E3/uL   Immature Granulocytes 0 Not Estab. %   Immature Grans (Abs) 0.0 0.0 - 0.1 x10E3/uL  Comprehensive metabolic panel  Result Value Ref Range   Glucose 82 65 - 99 mg/dL   BUN 19 6 - 20 mg/dL   Creatinine, Ser 0.89 0.57 - 1.00 mg/dL   eGFR 92 >59 mL/min/1.73   BUN/Creatinine Ratio 21 9 - 23   Sodium 138 134 - 144 mmol/L   Potassium 4.3 3.5 - 5.2 mmol/L   Chloride 102 96 - 106 mmol/L   CO2 21 20 - 29 mmol/L   Calcium 9.5 8.7 - 10.2 mg/dL   Total Protein 7.6 6.0 - 8.5 g/dL   Albumin 5.0 3.9 - 5.0 g/dL   Globulin, Total 2.6 1.5 - 4.5 g/dL   Albumin/Globulin Ratio 1.9 1.2 - 2.2   Bilirubin Total 0.5 0.0 - 1.2 mg/dL   Alkaline Phosphatase 54 44 - 121 IU/L   AST 18 0 - 40 IU/L   ALT 14 0 - 32 IU/L  Lipid panel  Result Value Ref Range   Cholesterol, Total 191 100 - 199 mg/dL   Triglycerides 58 0 - 149 mg/dL   HDL 54 >39 mg/dL   VLDL Cholesterol Cal 11 5 - 40 mg/dL   LDL Chol Calc (NIH) 126 (H) 0 - 99 mg/dL   Chol/HDL Ratio 3.5 0.0 - 4.4 ratio  TSH  Result Value Ref Range   TSH 1.650 0.450 - 4.500 uIU/mL  POCT urinalysis dipstick  Result Value Ref Range   Color, UA yellow    Clarity, UA clear    Glucose, UA Negative Negative   Bilirubin, UA negative    Ketones, UA negative    Spec Grav, UA <=1.005 (A) 1.010 - 1.025   Blood, UA negative    pH, UA 7.0 5.0 - 8.0   Protein, UA Negative Negative   Urobilinogen, UA 0.2 0.2 or 1.0 E.U./dL   Nitrite, UA negative    Leukocytes, UA Negative Negative   Appearance     Odor      Assessment & Plan     Thrombophilia (HCC)  Factor 5 Leiden mutation, heterozygous (HCC)  Prothrombin gene mutation (HCC)  Irritable bowel syndrome with both constipation and diarrhea - Plan: CBC with Differential/Platelet, Comprehensive metabolic panel, Lipid panel, TSH, Ambulatory referral to Gastroenterology  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  Orders Placed This Encounter  Procedures  . CBC with Differential/Platelet  .  Comprehensive metabolic panel  . Lipid panel  . TSH  . Ambulatory referral to Gastroenterology    Referral Priority:   Routine    Referral Type:   Consultation    Referral Reason:   Specialty Services Required    Referred to Provider:   Lucilla Lame, MD    Number of Visits Requested:   1  . POCT urinalysis dipstick    Return in about 3 months (around 05/04/2021), or if symptoms worsen or fail to improve, for at any time for any worsening symptoms, Go to Emergency room/ urgent care if worse.        Marcille Buffy, [place] [phone] (phone) [phone] (fax)  [place]

## 2021-02-04 LAB — CBC WITH DIFFERENTIAL/PLATELET
Basophils Absolute: 0.1 10*3/uL (ref 0.0–0.2)
Basos: 1 %
EOS (ABSOLUTE): 0.2 10*3/uL (ref 0.0–0.4)
Eos: 3 %
Hematocrit: 40.9 % (ref 34.0–46.6)
Hemoglobin: 13.3 g/dL (ref 11.1–15.9)
Immature Grans (Abs): 0 10*3/uL (ref 0.0–0.1)
Immature Granulocytes: 0 %
Lymphocytes Absolute: 1.8 10*3/uL (ref 0.7–3.1)
Lymphs: 33 %
MCH: 29.8 pg (ref 26.6–33.0)
MCHC: 32.5 g/dL (ref 31.5–35.7)
MCV: 92 fL (ref 79–97)
Monocytes Absolute: 0.4 10*3/uL (ref 0.1–0.9)
Monocytes: 8 %
Neutrophils Absolute: 3 10*3/uL (ref 1.4–7.0)
Neutrophils: 55 %
Platelets: 306 10*3/uL (ref 150–450)
RBC: 4.46 x10E6/uL (ref 3.77–5.28)
RDW: 11.8 % (ref 11.7–15.4)
WBC: 5.5 10*3/uL (ref 3.4–10.8)

## 2021-02-04 LAB — COMPREHENSIVE METABOLIC PANEL
ALT: 14 IU/L (ref 0–32)
AST: 18 IU/L (ref 0–40)
Albumin/Globulin Ratio: 1.9 (ref 1.2–2.2)
Albumin: 5 g/dL (ref 3.9–5.0)
Alkaline Phosphatase: 54 IU/L (ref 44–121)
BUN/Creatinine Ratio: 21 (ref 9–23)
BUN: 19 mg/dL (ref 6–20)
Bilirubin Total: 0.5 mg/dL (ref 0.0–1.2)
CO2: 21 mmol/L (ref 20–29)
Calcium: 9.5 mg/dL (ref 8.7–10.2)
Chloride: 102 mmol/L (ref 96–106)
Creatinine, Ser: 0.89 mg/dL (ref 0.57–1.00)
Globulin, Total: 2.6 g/dL (ref 1.5–4.5)
Glucose: 82 mg/dL (ref 65–99)
Potassium: 4.3 mmol/L (ref 3.5–5.2)
Sodium: 138 mmol/L (ref 134–144)
Total Protein: 7.6 g/dL (ref 6.0–8.5)
eGFR: 92 mL/min/{1.73_m2} (ref >59)

## 2021-02-04 LAB — LIPID PANEL
Chol/HDL Ratio: 3.5 ratio (ref 0.0–4.4)
Cholesterol, Total: 191 mg/dL (ref 100–199)
HDL: 54 mg/dL (ref >39)
LDL Chol Calc (NIH): 126 mg/dL — ABNORMAL HIGH (ref 0–99)
Triglycerides: 58 mg/dL (ref 0–149)
VLDL Cholesterol Cal: 11 mg/dL (ref 5–40)

## 2021-02-04 LAB — TSH: TSH: 1.65 u[IU]/mL (ref 0.450–4.500)

## 2021-02-06 ENCOUNTER — Encounter: Payer: Self-pay | Admitting: *Deleted

## 2021-02-06 NOTE — Progress Notes (Signed)
CBC, CMP,TSH, within normal limits.  LDL elevated.  Discuss lifestyle modification with patient e.g. increase exercise, fiber, fruits, vegetables, lean meat, and omega 3/fish intake and decrease saturated fat.  If patient following strict diet and exercise program already please schedule follow up appointment with primary care physician

## 2021-03-28 ENCOUNTER — Ambulatory Visit: Payer: BC Managed Care – PPO | Admitting: Gastroenterology

## 2021-03-28 ENCOUNTER — Encounter: Payer: Self-pay | Admitting: Gastroenterology

## 2021-03-28 ENCOUNTER — Other Ambulatory Visit: Payer: Self-pay

## 2021-03-28 VITALS — BP 112/68 | HR 61 | Ht 65.0 in | Wt 139.4 lb

## 2021-03-28 DIAGNOSIS — K582 Mixed irritable bowel syndrome: Secondary | ICD-10-CM | POA: Diagnosis not present

## 2021-03-28 MED ORDER — DESIPRAMINE HCL 25 MG PO TABS: 25.0000 mg | ORAL_TABLET | Freq: Every day | ORAL | 3 refills | Status: DC

## 2021-03-28 NOTE — Progress Notes (Signed)
Gastroenterology Consultation  Referring Provider:     Sharmon Leyden* Primary Care Physician:  Doreen Beam, FNP Primary Gastroenterologist:  Dr. Allen Norris     Reason for Consultation:     Irritable bowel syndrome        HPI:   Monica Singleton is a 25 y.o. y/o female referred for consultation & management of Irritable bowel syndrome by Dr. Claudina Lick, Kelby Aline, FNP.  This patient comes in today with a history of alternating diarrhea and constipation with stool urgency and bloating.  She states that she feels better when she moves her bowels.  She thinks that stress also contributes to her symptoms.  The patient denies any unexplained weight loss fevers chills nausea vomiting black stools or bloody stools.  She also tries to avoid taking anything for her diarrhea because she will then get constipated and vice versa when she has the constipation.  The patient denies any family history of colon cancer colon polyps.  She also denies any black stools or bloody stools. The patient reports that she does eat a high fiber diet.  The patient has a history of factor II and factor V Leiden deficiencies.  She denies any worrisome symptoms associated with her change in bowel habits and states that it has been going on for some time.  Past Medical History:  Diagnosis Date  . Allergy   . Clotting disorder ([place])   . 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 Factor V Leiden [facility])     Past Surgical History:  Procedure Laterality Date  . EYE SURGERY    . WISDOM TOOTH EXTRACTION  03/2020    Prior to Admission medications   Medication Sig Start Date End Date Taking? Authorizing Provider  desipramine (NORPRAMIN) 25 MG tablet Take 1 tablet (25 mg total) by mouth daily. 03/28/21  Yes Lucilla Lame, MD  levonorgestrel (MIRENA, 52 MG,) 20 MCG/24HR IUD 1 each by Intrauterine route once.    [provider]  rivaroxaban (XARELTO) 10 MG TABS tablet Take one tablet the day before travel, one tablet the  day of travel, and one tablet the day after travel. 10/19/14 07/31/25  [provider]    Family History  Problem Relation Age of Onset  . Depression Mother   . Factor V Leiden deficiency Father   . Skin cancer Father   . Arthritis Paternal Grandmother   . Clotting disorder Paternal Grandmother   . Cataracts Paternal Grandmother      Social History   Tobacco Use  . Smoking status: Current Every Day Smoker  . Smokeless tobacco: Never Used  Vaping Use  . Vaping Use: Never used  Substance Use Topics  . Alcohol use: Yes    Alcohol/week: 6.0 standard drinks    Types: 1 Glasses of wine, 5 Cans of beer per week    Comment: occasionally  . Drug use: Never    Allergies as of 03/28/2021 - Review Complete 03/28/2021  Allergen Reaction Noted  . Nickel Rash 09/09/2015    Review of Systems:    All systems reviewed and negative except where noted in HPI.   Physical Exam:  BP 112/68   Pulse 61   Ht 5\' 5"  (1.651 m)   Wt 139 lb 6.4 oz (63.2 kg)   BMI 23.20 kg/m  No LMP recorded. (Menstrual status: IUD). General:   Alert,  Well-developed, well-nourished, pleasant and cooperative in NAD Head:  Normocephalic and atraumatic. Eyes:  Sclera clear, no icterus.  Conjunctiva pink. Ears:  Normal auditory acuity. Neck:  Supple; no masses or thyromegaly. Lungs:  Respirations even and unlabored.  Clear throughout to auscultation.   No wheezes, crackles, or rhonchi. No acute distress. Heart:  Regular rate and rhythm; no murmurs, clicks, rubs, or gallops. Abdomen:  Normal bowel sounds.  No bruits.  Soft, non-tender and non-distended without masses, hepatosplenomegaly or hernias noted.  No guarding or rebound tenderness.  Negative Carnett sign.   Rectal:  Deferred.  Pulses:  Normal pulses noted. Extremities:  No clubbing or edema.  No cyanosis. Neurologic:  Alert and oriented x3;  grossly normal neurologically. Skin:  Intact without significant lesions or rashes.  No jaundice. Lymph  Nodes:  No significant cervical adenopathy. Psych:  Alert and cooperative. Normal mood and affect.  Imaging Studies: No results found.  Assessment and Plan:   Monica Singleton is a 25 y.o. y/o female who comes in today with a history of irritable bowel syndrome with alternating diarrhea and constipation.  The patient fits the Rome IV criteria for irritable bowel syndrome. The patient has been explained this and has been told to start Citrucel once a day and see if that helps and she may increase it to twice a day.  The patient has also been told to start desipramine 25 mg daily at bedtime.  The patient will have her blood sent off to check for celiac sprue since irritable bowel syndrome and sprue can often be seen together. The patient has been explained the plan and agrees with it.    Lucilla Lame, MD. [facility] Regal    Note: This dictation was prepared with Dragon dictation along with smaller phrase technology. Any transcriptional errors that result from this process are unintentional.

## 2021-03-30 ENCOUNTER — Telehealth: Payer: Self-pay

## 2021-03-30 LAB — CELIAC DISEASE PANEL
Endomysial IgA: NEGATIVE
IgA/Immunoglobulin A, Serum: 158 mg/dL (ref 87–352)
Transglutaminase IgA: <2 U/mL (ref 0–3)

## 2021-03-30 NOTE — Telephone Encounter (Signed)
Started Desipramine two days ago. Had slight that has broken, unable to sleep and super nausea. Wants to know if she should stop the meds or give it a few more days? Can you send her a Mychart message with response?

## 2021-03-30 NOTE — Telephone Encounter (Signed)
-----   Message from Lucilla Lame, MD sent at 03/30/2021  7:36 AM EDT ----- Let the patient know that her blood test for celiac was negative.

## 2021-03-30 NOTE — Telephone Encounter (Signed)
Pt notified of lab result through mychart.

## 2021-04-18 ENCOUNTER — Ambulatory Visit: Payer: Self-pay | Admitting: *Deleted

## 2021-04-18 NOTE — Telephone Encounter (Signed)
Pt reports dry cough, CP, onset 2 days ago. States fever 2 days ago MAx 101.3.  Reports "Broke last night." Did covid home test Sunday, negative. Reports cough keeping her awake at night, CP constant, worse with deep breathing.  Advised to repeat covid test, ED eval. Pt states will follow disposition.  Reason for Disposition . Chest pain  (Exception: MILD central chest pain, present only when coughing)  Answer Assessment - Initial Assessment Questions 1. ONSET: "When did the cough begin?"     2 days ago 2. SEVERITY: "How bad is the cough today?"      Moderate 3. SPUTUM: "Describe the color of your sputum" (none, dry cough; clear, white, yellow, green)    dry 4. HEMOPTYSIS: "Are you coughing up any blood?" If so ask: "How much?" (flecks, streaks, tablespoons, etc.)    no 5. DIFFICULTY BREATHING: "Are you having difficulty breathing?" If Yes, ask: "How bad is it?" (e.g., mild, moderate, severe)    - MILD: No SOB at rest, mild SOB with walking, speaks normally in sentences, can lie down, no retractions, pulse < 100.    - MODERATE: SOB at rest, SOB with minimal exertion and prefers to sit, cannot lie down flat, speaks in phrases, mild retractions, audible wheezing, pulse 100-120.    - SEVERE: Very SOB at rest, speaks in single words, struggling to breathe, sitting hunched forward, retractions, pulse > 120     no 6. FEVER: "Do you have a fever?" If Yes, ask: "What is your temperature, how was it measured, and when did it start?"    Broke last night . Max 101.3 7. CARDIAC HISTORY: "Do you have any history of heart disease?" (e.g., heart attack, congestive heart failure)      *No Answer* 8. LUNG HISTORY: "Do you have any history of lung disease?"  (e.g., pulmonary embolus, asthma, emphysema)     *No Answer* 9. PE RISK FACTORS: "Do you have a history of blood clots?" (or: recent major surgery, recent prolonged travel, bedridden)     *No Answer* 10. OTHER SYMPTOMS: "Do you have any other symptoms?"  (e.g., runny nose, wheezing, chest pain)      Chest pain, worse with coughing and taking a deep breath.  11. PREGNANCY: "Is there any chance you are pregnant?" "When was your last menstrual period?"       *No Answer*  Protocols used: COUGH - ACUTE NON-PRODUCTIVE-A-AH

## 2021-05-08 ENCOUNTER — Telehealth: Payer: Self-pay

## 2021-05-08 NOTE — Telephone Encounter (Signed)
Copied from [place] [id number]. Topic: Referral - Status >> May 08, 2021 12:25 PM Pawlus, Brayton Layman A wrote: Reason for CRM: Pt hurt her shoulder and requested a referral for PT, please advise if this is possible.

## 2021-05-08 NOTE — Telephone Encounter (Signed)
Appointment scheduled.

## 2021-05-08 NOTE — Telephone Encounter (Signed)
Needs office visit. Either here or with Sharyn Lull at [facility].

## 2021-05-08 NOTE — Telephone Encounter (Signed)
Tried calling patient. Left message to call back. OK for [facility] to advise and schedule appointment.

## 2021-05-09 ENCOUNTER — Other Ambulatory Visit: Payer: Self-pay

## 2021-05-09 ENCOUNTER — Ambulatory Visit: Payer: BC Managed Care – PPO | Admitting: Family Medicine

## 2021-05-09 ENCOUNTER — Ambulatory Visit: Payer: Self-pay | Admitting: Family Medicine

## 2021-05-09 ENCOUNTER — Encounter: Payer: Self-pay | Admitting: Family Medicine

## 2021-05-09 VITALS — BP 114/78 | HR 77 | Wt 136.0 lb

## 2021-05-09 DIAGNOSIS — S46911A Strain of unspecified muscle, fascia and tendon at shoulder and upper arm level, right arm, initial encounter: Secondary | ICD-10-CM | POA: Diagnosis not present

## 2021-05-09 NOTE — Progress Notes (Signed)
      Established patient visit   Patient: Monica Singleton   DOB: 08/29/1996   24 y.o. Female  MRN: [id number] Visit Date: 05/09/2021  Today's healthcare provider: Lelon Huh, MD   Chief Complaint  Patient presents with   Shoulder Pain    Right shoulder    Subjective    Shoulder Pain  The pain is present in the right shoulder. This is a chronic problem. The problem has been gradually worsening (Chronic issue for 9 years but worsening in the last week.). Associated symptoms include a limited range of motion. Pertinent negatives include no joint locking, numbness, stiffness or tingling. Exacerbated by: Certain movements. She has tried cold and NSAIDS for the symptoms. The treatment provided mild relief.  She states original injuries was she playing high school soccer, but doesn't recall exactly how the injury occurred. Now she only has pain her shoulder when she does shoulder flexion and abduction exercises, and she can hardly move her shoulder several days afterwards. Current flare started when she tried to do exercises last week. Has not had any clicking or popping out of joint. Is asymptomatic between flare ups.      Medications: Outpatient Medications Prior to Visit  Medication Sig   levonorgestrel (MIRENA, 52 MG,) 20 MCG/24HR IUD 1 each by Intrauterine route once.   rivaroxaban (XARELTO) 10 MG TABS tablet Take one tablet the day before travel, one tablet the day of travel, and one tablet the day after travel.   desipramine (NORPRAMIN) 25 MG tablet Take 1 tablet (25 mg total) by mouth daily. (Patient not taking: Reported on 05/09/2021)   No facility-administered medications prior to visit.    Review of Systems  Musculoskeletal:  Negative for stiffness.  Neurological:  Negative for tingling and numbness.      Objective    BP 114/78 (BP Location: Left Arm, Patient Position: Sitting, Cuff Size: Normal)   Pulse 77   Wt 136 lb (61.7 kg)   SpO2 100%   BMI 22.63 kg/m      Physical Exam  General appearance: Well developed, well nourished female, cooperative and in no acute distress Head: Normocephalic, without obvious abnormality, atraumatic Respiratory: Respirations even and unlabored, normal respiratory rate Extremities: Full passive range of motion. Pain with flexion and abduction >90 degrees, but does have full active ROM. Slightly tender over anterior and lateral insertion f deltoid.     Assessment & Plan     1. Strain of right shoulder, initial encounter Chronic since high school soccer injury, is limiting her exercise regiment.  - Ambulatory referral to Physical Therapy   Consider orthopedic referral if not greatly improved with physical therapy.       The entirety of the information documented in the History of Present Illness, Review of Systems and Physical Exam were personally obtained by me. Portions of this information were initially documented by the CMA and reviewed by me for thoroughness and accuracy.     Lelon Huh, MD  [facility] [phone] (phone) [phone] (fax)  [place]

## 2021-09-19 ENCOUNTER — Telehealth: Payer: BC Managed Care – PPO | Admitting: Physician Assistant

## 2021-09-19 ENCOUNTER — Ambulatory Visit: Payer: Self-pay

## 2021-09-19 DIAGNOSIS — J208 Acute bronchitis due to other specified organisms: Secondary | ICD-10-CM | POA: Diagnosis not present

## 2021-09-19 DIAGNOSIS — B9689 Other specified bacterial agents as the cause of diseases classified elsewhere: Secondary | ICD-10-CM | POA: Diagnosis not present

## 2021-09-19 MED ORDER — AZITHROMYCIN 250 MG PO TABS: ORAL_TABLET | ORAL | 0 refills | Status: AC

## 2021-09-19 MED ORDER — BENZONATATE 100 MG PO CAPS: 100.0000 mg | ORAL_CAPSULE | Freq: Three times a day (TID) | ORAL | 0 refills | Status: DC | PRN

## 2021-09-19 NOTE — Progress Notes (Signed)
I have spent 5 minutes in review of e-visit questionnaire, review and updating patient chart, medical decision making and response to patient.   Avrie Kedzierski Cody Elimelech Houseman, PA-C    

## 2021-09-19 NOTE — Progress Notes (Signed)

## 2021-09-19 NOTE — Telephone Encounter (Signed)
The patient has been experiencing cough, congestion and sore throat for roughly a week and a half   The patient shares that they have taken Nyquil previously but it was ineffective   Please contact further when possible Pt. Started coughing 2 weeks ago. Initially had a fever. Has cough, non-productive. "Feels like there is something that needs to come up." Cough is waking pt. During the night. Mild shortness of breath with coughing spells. No availability this week in the practice. Pt. Will do My Chart e-visit.    Answer Assessment - Initial Assessment Questions 1. ONSET: "When did the cough begin?"      1-2 weeks 2. SEVERITY: "How bad is the cough today?"      Severe 3. SPUTUM: "Describe the color of your sputum" (none, dry cough; clear, white, yellow, green)     None 4. HEMOPTYSIS: "Are you coughing up any blood?" If so ask: "How much?" (flecks, streaks, tablespoons, etc.)     No 5. DIFFICULTY BREATHING: "Are you having difficulty breathing?" If Yes, ask: "How bad is it?" (e.g., mild, moderate, severe)    - MILD: No SOB at rest, mild SOB with walking, speaks normally in sentences, can lie down, no retractions, pulse < 100.    - MODERATE: SOB at rest, SOB with minimal exertion and prefers to sit, cannot lie down flat, speaks in phrases, mild retractions, audible wheezing, pulse 100-120.    - SEVERE: Very SOB at rest, speaks in single words, struggling to breathe, sitting hunched forward, retractions, pulse > 120      Mild 6. FEVER: "Do you have a fever?" If Yes, ask: "What is your temperature, how was it measured, and when did it start?"     Early on 7. CARDIAC HISTORY: "Do you have any history of heart disease?" (e.g., heart attack, congestive heart failure)      No 8. LUNG HISTORY: "Do you have any history of lung disease?"  (e.g., pulmonary embolus, asthma, emphysema)     No 9. PE RISK FACTORS: "Do you have a history of blood clots?" (or: recent major surgery, recent prolonged  travel, bedridden)     No 10. OTHER SYMPTOMS: "Do you have any other symptoms?" (e.g., runny nose, wheezing, chest pain)       No 11. PREGNANCY: "Is there any chance you are pregnant?" "When was your last menstrual period?"       No 12. TRAVEL: "Have you traveled out of the country in the last month?" (e.g., travel history, exposures)       No  Protocols used: Cough - Acute Non-Productive-A-AH

## 2021-09-19 NOTE — Telephone Encounter (Signed)
Noted  

## 2021-09-25 ENCOUNTER — Ambulatory Visit: Payer: Self-pay | Admitting: *Deleted

## 2021-09-25 NOTE — Telephone Encounter (Signed)
Patient is calling to report  she has grape size lump under the skin- painful to pressure- R side chest- under breast at ribs. Patient states it has been present 2 months without change. Call to office- no appointment available- request note for review- disposition- 3 days. Patient is aware and she will await call back

## 2021-09-25 NOTE — Telephone Encounter (Signed)
Patient called in and scheduled an appointment say that she have a lump under her right breast about 2 months say that it hurts on and off but concerned. Can be reached at Ph# [phone]  Reason for Disposition  [1] Small swelling or lump AND [2] unexplained AND [3] present > 1 week  Answer Assessment - Initial Assessment Questions 1. APPEARANCE of SWELLING: "What does it look like?" (e.g., lymph node, insect bite, mole)     Lump under the skin 2. SIZE: "How large is the swelling?" (e.g., inches, cm; or compare to size of pinhead, tip of pen, eraser, coin, pea, grape, ping pong ball)      Grape size 3. LOCATION: "Where is the swelling located?"     R side under breast at ribs 4. ONSET: "When did the swelling start?"     2 months 5. PAIN: "Is it painful?" If Yes, ask: "How much?"     Yes- hurt to palpate- press 6. ITCH: "Does it itch?" If Yes, ask: "How much?"     no 7. CAUSE: "What do you think caused the swelling?"     Not sure 8. OTHER SYMPTOMS: "Do you have any other symptoms?" (e.g., fever)     no  Protocols used: Skin Lump or Localized Swelling-A-AH

## 2021-09-25 NOTE — Telephone Encounter (Signed)
Please advise how soon she can be seen. Daneil Dan next available is 11/11-same day and Ria Comment is 11/10-sane day.

## 2021-09-25 NOTE — Telephone Encounter (Signed)
Ok to take one of those appts. Has been stable for 2 months.

## 2021-09-28 ENCOUNTER — Ambulatory Visit: Payer: BC Managed Care – PPO | Admitting: Family Medicine

## 2021-09-28 ENCOUNTER — Encounter: Payer: Self-pay | Admitting: Family Medicine

## 2021-09-28 ENCOUNTER — Other Ambulatory Visit: Payer: Self-pay

## 2021-09-28 VITALS — BP 112/69 | HR 60 | Resp 16 | Wt 140.4 lb

## 2021-09-28 DIAGNOSIS — D682 Hereditary deficiency of other clotting factors: Secondary | ICD-10-CM | POA: Diagnosis not present

## 2021-09-28 DIAGNOSIS — D6851 Activated protein C resistance: Secondary | ICD-10-CM | POA: Diagnosis not present

## 2021-09-28 DIAGNOSIS — N644 Mastodynia: Secondary | ICD-10-CM | POA: Diagnosis not present

## 2021-09-28 DIAGNOSIS — N6312 Unspecified lump in the right breast, upper inner quadrant: Secondary | ICD-10-CM | POA: Diagnosis not present

## 2021-09-28 NOTE — Assessment & Plan Note (Signed)
Pt reported Unable to palpate Appears cystic in nature Advise [place] to r/o other abnormalities given length of pain

## 2021-09-28 NOTE — Assessment & Plan Note (Signed)
Chronic, stable On AC F/b [facility]

## 2021-09-28 NOTE — Assessment & Plan Note (Signed)
3 month hx of pain

## 2021-09-28 NOTE — Progress Notes (Signed)
Established patient visit   Patient: Monica Singleton   DOB: 20-May-1996   25 y.o. Female  MRN: [id number] Visit Date: 09/28/2021  Today's healthcare provider: Gwyneth Sprout, FNP   Chief Complaint  Patient presents with   Breast Mass    Patient comes in office today with concerns of a possible lump/mass that she has found in her right breast, patient states that area in question has been present for 3 months.    Subjective    HPI HPI     Breast Mass    Additional comments: Patient comes in office today with concerns of a possible lump/mass that she has found in her right breast, patient states that area in question has been present for 3 months.       Last edited by Minette Headland, CMA on 09/28/2021  9:07 AM.         Medications: Outpatient Medications Prior to Visit  Medication Sig   levonorgestrel (MIRENA, 52 MG,) 20 MCG/24HR IUD 1 each by Intrauterine route once.   rivaroxaban (XARELTO) 10 MG TABS tablet Take one tablet the day before travel, one tablet the day of travel, and one tablet the day after travel.   benzonatate (TESSALON) 100 MG capsule Take 1 capsule (100 mg total) by mouth 3 (three) times daily as needed for cough.   No facility-administered medications prior to visit.    Review of Systems     Objective    BP 112/69   Pulse 60   Resp 16   Wt 140 lb 6.4 oz (63.7 kg)   SpO2 100%   BMI 23.36 kg/m    Physical Exam Vitals and nursing note reviewed. Exam conducted with a chaperone present.  Constitutional:      General: She is not in acute distress.    Appearance: Normal appearance. She is normal weight. She is not ill-appearing, toxic-appearing or diaphoretic.  HENT:     Head: Normocephalic and atraumatic.  Cardiovascular:     Rate and Rhythm: Normal rate and regular rhythm.     Pulses: Normal pulses.     Heart sounds: Normal heart sounds. No murmur heard.   No friction rub. No gallop.  Pulmonary:     Effort: Pulmonary effort  is normal. No respiratory distress.     Breath sounds: Normal breath sounds. No stridor. No wheezing, rhonchi or rales.  Chest:     Chest wall: No tenderness.       Comments: No focal mass felt, appears cystic in nature Given length of s/s- will advise US  Abdominal:     General: Bowel sounds are normal.     Palpations: Abdomen is soft.  Musculoskeletal:        General: No swelling, tenderness, deformity or signs of injury. Normal range of motion.     Right lower leg: No edema.     Left lower leg: No edema.  Skin:    General: Skin is warm and dry.     Capillary Refill: Capillary refill takes less than 2 seconds.     Coloration: Skin is not jaundiced or pale.     Findings: No bruising, erythema, lesion or rash.  Neurological:     General: No focal deficit present.     Mental Status: She is alert and oriented to person, place, and time. Mental status is at baseline.     Cranial Nerves: No cranial nerve deficit.     Sensory: No sensory deficit.  Motor: No weakness.     Coordination: Coordination normal.  Psychiatric:        Mood and Affect: Mood normal.        Behavior: Behavior normal.        Thought Content: Thought content normal.        Judgment: Judgment normal.      No results found for any visits on 09/28/21.  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      Vonna Kotyk, FNP, have reviewed all documentation for this visit. The documentation on 09/28/21 for the exam, diagnosis, procedures, and orders are all accurate and complete.    Gwyneth Sprout, [place] [phone] (phone) [phone] (fax)  [place]

## 2021-09-28 NOTE — Progress Notes (Deleted)
Complete physical exam   Patient: Monica Singleton   DOB: 04-03-1996   25 y.o. Female  MRN: [id number] Visit Date: 09/28/2021  Today's healthcare provider: Gwyneth Sprout, FNP   Chief Complaint  Patient presents with   Breast Mass    Patient comes in office today with concerns of a possible lump/mass that she has found in her right breast, patient states that area in question has been present for 3 months.    Subjective    Monica Singleton is a 25 y.o. female who presents today for a complete physical exam.  She reports consuming a {diet types:17450} diet. {Exercise:19826} She generally feels {well/fairly well/poorly:18703}. She reports sleeping {well/fairly well/poorly:18703}. She {does/does not:200015} have additional problems to discuss today.  HPI HPI     Breast Mass           Comments: Patient comes in office today with concerns of a possible lump/mass that she has found in her right breast, patient states that area in question has been present for 3 months.        Last edited by Minette Headland, CMA on 09/28/2021  9:07 AM.      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[place])    Factor V Leiden ([place])    Past Surgical History:  Procedure Laterality Date   EYE SURGERY     WISDOM TOOTH EXTRACTION  03/2020   Social History   Socioeconomic History   Marital status: Married    Spouse name: Not on file   Number of children: Not on file   Years of education: Not on file   Highest education level: Not on file  Occupational History   Not on file  Tobacco Use   Smoking status: Every Day   Smokeless tobacco: Never  Vaping Use   Vaping Use: Never used  Substance and Sexual Activity   Alcohol use: Yes    Alcohol/week: 6.0 standard drinks    Types: 1 Glasses of wine, 5 Cans of beer per week    Comment: occasionally   Drug use: Never   Sexual activity: Yes    Birth control/protection:  I.U.D.  Other Topics Concern   Not on file  Social History Narrative   Not on file   Social Determinants of Health   Financial Resource Strain: Not on file  Food Insecurity: Not on file  Transportation Needs: Not on file  Physical Activity: Not on file  Stress: Not on file  Social Connections: Not on file  Intimate Partner Violence: Not on file   Family Status  Relation Name Status   Mother  Alive   Father  Alive   PGM  (Not Specified)   Family History  Problem Relation Age of Onset   Depression Mother    Factor V Leiden deficiency Father    Skin cancer Father    Arthritis Paternal Grandmother    Clotting disorder Paternal Grandmother    Cataracts Paternal Grandmother    Allergies  Allergen Reactions   Nickel Rash    Patient Care Team: Bacigalupo, Dionne Bucy, MD as PCP - General (Family Medicine)   Medications: Outpatient Medications Prior to Visit  Medication Sig   levonorgestrel (MIRENA, 52 MG,) 20 MCG/24HR IUD 1 each by Intrauterine route once.   rivaroxaban (XARELTO) 10 MG TABS tablet Take one tablet the day before travel, one tablet the day of  travel, and one tablet the day after travel.   benzonatate (TESSALON) 100 MG capsule Take 1 capsule (100 mg total) by mouth 3 (three) times daily as needed for cough.   No facility-administered medications prior to visit.    Review of Systems  {Labs  Heme  Chem  Endocrine  Serology  Results Review (optional):23779}  Objective    BP 112/69   Pulse 60   Resp 16   Wt 140 lb 6.4 oz (63.7 kg)   SpO2 100%   BMI 23.36 kg/m  {Show previous vital signs (optional):23777}  Physical Exam  ***  Last depression screening scores PHQ 2/9 Scores 05/09/2021 02/01/2021  PHQ - 2 Score 0 0  PHQ- 9 Score 1 2   Last fall risk screening Fall Risk  05/09/2021  Falls in the past year? 0  Number falls in past yr: 0  Injury with Fall? 0  Risk for fall due to : No Fall Risks  Follow up Falls evaluation completed   Last  Audit-C alcohol use screening Alcohol Use Disorder Test (AUDIT) 05/09/2021  1. How often do you have a drink containing alcohol? 3  2. How many drinks containing alcohol do you have on a typical day when you are drinking? 1  3. How often do you have six or more drinks on one occasion? 1  AUDIT-C Score 5   A score of 3 or more in women, and 4 or more in men indicates increased risk for alcohol abuse, EXCEPT if all of the points are from question 1   No results found for any visits on 09/28/21.  Assessment & Plan    Routine Health Maintenance and Physical Exam  Exercise Activities and Dietary recommendations  Goals   None     Immunization History  Administered Date(s) Administered   Moderna Sars-Covid-2 Vaccination 02/05/2020, 03/08/2020    Health Maintenance  Topic Date Due   Pneumococcal Vaccine 6-99 Years old (1 - PCV) Never done   PAP-Cervical Cytology Screening  Never done   COVID-19 Vaccine (3 - Booster for Moderna series) 05/03/2020   INFLUENZA VACCINE  Never done   HPV VACCINES (1 - 2-dose series) 02/01/2022 (Originally 09/22/2006)   TETANUS/TDAP  02/01/2022 (Originally 09/22/2014)   Hepatitis C Screening  02/01/2022 (Originally 09/22/2013)   HIV Screening  02/01/2022 (Originally 09/22/2010)   PAP SMEAR-Modifier  02/02/2024    Discussed health benefits of physical activity, and encouraged her to engage in regular exercise appropriate for her age and condition.  ***  No follow-ups on file.     {provider attestation***:1}   Gwyneth Sprout, [place] [phone] (phone) [phone] (fax)  [place]

## 2021-09-28 NOTE — Assessment & Plan Note (Signed)
Chronic, stable On AC F/b [facility]

## 2021-10-04 ENCOUNTER — Other Ambulatory Visit: Payer: Self-pay

## 2021-10-04 ENCOUNTER — Ambulatory Visit
Admission: RE | Admit: 2021-10-04 | Discharge: 2021-10-04 | Disposition: A | Discharge status: Home / Self Care | Payer: BC Managed Care – PPO | Source: Ambulatory Visit | Attending: Family Medicine | Admitting: Family Medicine

## 2021-10-04 DIAGNOSIS — N644 Mastodynia: Secondary | ICD-10-CM | POA: Diagnosis present

## 2021-10-04 DIAGNOSIS — N6312 Unspecified lump in the right breast, upper inner quadrant: Secondary | ICD-10-CM | POA: Diagnosis present

## 2021-10-14 ENCOUNTER — Telehealth: Payer: BC Managed Care – PPO | Admitting: Emergency Medicine

## 2021-10-14 DIAGNOSIS — N898 Other specified noninflammatory disorders of vagina: Secondary | ICD-10-CM

## 2021-10-14 MED ORDER — FLUCONAZOLE 150 MG PO TABS: ORAL_TABLET | ORAL | 0 refills | Status: DC

## 2021-10-14 NOTE — Progress Notes (Signed)
I have spent 5 minutes in review of e-visit questionnaire, review and updating patient chart, medical decision making and response to patient.   Mandee Pluta, PA-C    

## 2021-10-14 NOTE — Progress Notes (Signed)

## 2021-10-16 ENCOUNTER — Ambulatory Visit: Payer: BC Managed Care – PPO | Admitting: Family Medicine

## 2021-10-20 ENCOUNTER — Telehealth: Payer: Self-pay

## 2021-10-20 NOTE — Telephone Encounter (Signed)
Daneil Dan does not have openings until 10/26/21, she does have a same day appt time on 10/24/21 at 3:40PM.Called and spoke with patient and took same day appt time for 10/24/21 for evaluation, no other providers had openings sooner. KW

## 2021-10-20 NOTE — Telephone Encounter (Signed)
Copied from [place] [phone]. Topic: Appointment Scheduling - Scheduling Inquiry for Clinic >> Oct 20, 2021  3:08 PM Greggory Keen D wrote: Reason for CRM: Pt wants to see someone asap for chronic fatigue, rapid weight , very emotional.  She does not care who she sees.  CB#  [phone]

## 2021-10-24 ENCOUNTER — Encounter: Payer: Self-pay | Admitting: Family Medicine

## 2021-10-24 ENCOUNTER — Ambulatory Visit: Payer: BC Managed Care – PPO | Admitting: Family Medicine

## 2021-10-24 ENCOUNTER — Other Ambulatory Visit: Payer: Self-pay

## 2021-10-24 VITALS — BP 116/89 | HR 64 | Ht 65.0 in | Wt 146.5 lb

## 2021-10-24 DIAGNOSIS — R140 Abdominal distension (gaseous): Secondary | ICD-10-CM | POA: Diagnosis not present

## 2021-10-24 DIAGNOSIS — R5382 Chronic fatigue, unspecified: Secondary | ICD-10-CM | POA: Diagnosis not present

## 2021-10-24 DIAGNOSIS — R4589 Other symptoms and signs involving emotional state: Secondary | ICD-10-CM

## 2021-10-24 DIAGNOSIS — K5900 Constipation, unspecified: Secondary | ICD-10-CM | POA: Diagnosis not present

## 2021-10-24 DIAGNOSIS — F419 Anxiety disorder, unspecified: Secondary | ICD-10-CM

## 2021-10-24 DIAGNOSIS — R635 Abnormal weight gain: Secondary | ICD-10-CM

## 2021-10-24 NOTE — Assessment & Plan Note (Signed)
Denies stressors Does not want to start SSRI

## 2021-10-24 NOTE — Assessment & Plan Note (Signed)
Denies depression Sleeps 10 hours a day, feels that she can go back to go bed given her tiredness

## 2021-10-24 NOTE — Assessment & Plan Note (Signed)
Tearful upon talking about symptoms

## 2021-10-24 NOTE — Assessment & Plan Note (Signed)
Acute on chronic concerns Recommend daily use of miralax  Advised of bloating s/s with use

## 2021-10-24 NOTE — Progress Notes (Signed)
Established patient visit   Patient: Monica Singleton   DOB: 31-Aug-1996   25 y.o. Female  MRN: [id number] Visit Date: 10/24/2021  Today's healthcare provider: Gwyneth Sprout, FNP   Chief Complaint  Patient presents with   Consult    Patient would like to discuss getting testing to check hormone levels. Patient reports fatigue, episodes of crying,  weight gain and decreased libido for >2 months.    Subjective    HPI HPI     Consult    Additional comments: Patient would like to discuss getting testing to check hormone levels. Patient reports fatigue, episodes of crying,  weight gain and decreased libido for >2 months.       Last edited by Minette Headland, CMA on 10/24/2021  3:54 PM.       Medications: Outpatient Medications Prior to Visit  Medication Sig   levonorgestrel (MIRENA, 52 MG,) 20 MCG/24HR IUD 1 each by Intrauterine route once.   rivaroxaban (XARELTO) 10 MG TABS tablet Take one tablet the day before travel, one tablet the day of travel, and one tablet the day after travel.   [DISCONTINUED] fluconazole (DIFLUCAN) 150 MG tablet Take one dose by mouth, wait 72 hours, and then take second dose by mouth   No facility-administered medications prior to visit.    Review of Systems     Objective    BP 116/89   Pulse 64   Ht 5\' 5"  (1.651 m)   Wt 146 lb 8 oz (66.5 kg)   SpO2 100%   BMI 24.38 kg/m    Physical Exam Vitals and nursing note reviewed.  Constitutional:      General: She is not in acute distress.    Appearance: Normal appearance. She is normal weight. She is not ill-appearing, toxic-appearing or diaphoretic.  HENT:     Head: Normocephalic and atraumatic.  Cardiovascular:     Rate and Rhythm: Normal rate and regular rhythm.     Pulses: Normal pulses.     Heart sounds: Normal heart sounds. No murmur heard.   No friction rub. No gallop.  Pulmonary:     Effort: Pulmonary effort is normal. No respiratory distress.     Breath sounds:  Normal breath sounds. No stridor. No wheezing, rhonchi or rales.  Chest:     Chest wall: No tenderness.  Abdominal:     General: Bowel sounds are normal.     Palpations: Abdomen is soft.  Musculoskeletal:        General: No swelling, tenderness, deformity or signs of injury. Normal range of motion.     Right lower leg: No edema.     Left lower leg: No edema.  Skin:    General: Skin is warm and dry.     Capillary Refill: Capillary refill takes less than 2 seconds.     Coloration: Skin is not jaundiced or pale.     Findings: No bruising, erythema, lesion or rash.  Neurological:     General: No focal deficit present.     Mental Status: She is alert and oriented to person, place, and time. Mental status is at baseline.     Cranial Nerves: No cranial nerve deficit.     Sensory: No sensory deficit.     Motor: No weakness.     Coordination: Coordination normal.  Psychiatric:        Mood and Affect: Mood normal.        Behavior: Behavior normal.  Thought Content: Thought content normal.        Judgment: Judgment normal.      No results found for any visits on 10/24/21.  Assessment & Plan     Problem List Items Addressed This Visit       Other   Chronic fatigue - Primary    Denies depression Sleeps 10 hours a day, feels that she can go back to go bed given her tiredness      Relevant Orders   TSH + free T4   Vitamin D (25 hydroxy)   Constipation    Acute on chronic concerns Recommend daily use of miralax  Advised of bloating s/s with use      Bloating    Patient report; not apparent not exam      Tearfulness    Tearful upon talking about symptoms      Relevant Orders   CBC with Differential/Platelet   Comprehensive metabolic panel   Thyroid peroxidase antibody   Anxiousness    Denies stressors Does not want to start SSRI      Relevant Orders   CBC with Differential/Platelet   Comprehensive metabolic panel   Thyroid peroxidase antibody   Other  Visit Diagnoses     Weight gain       Relevant Orders   Testosterone   Cortisol        No follow-ups on file.      Vonna Kotyk, FNP, have reviewed all documentation for this visit. The documentation on 10/24/21 for the exam, diagnosis, procedures, and orders are all accurate and complete.    Gwyneth Sprout, [place] [phone] (phone) [phone] (fax)  [place]

## 2021-10-24 NOTE — Assessment & Plan Note (Signed)
Patient report; not apparent not exam

## 2021-10-26 LAB — CBC WITH DIFFERENTIAL/PLATELET
Basophils Absolute: 0.1 10*3/uL (ref 0.0–0.2)
Basos: 1 %
EOS (ABSOLUTE): 0.1 10*3/uL (ref 0.0–0.4)
Eos: 2 %
Hematocrit: 40.8 % (ref 34.0–46.6)
Hemoglobin: 13.3 g/dL (ref 11.1–15.9)
Immature Grans (Abs): 0 10*3/uL (ref 0.0–0.1)
Immature Granulocytes: 0 %
Lymphocytes Absolute: 1.9 10*3/uL (ref 0.7–3.1)
Lymphs: 35 %
MCH: 29.8 pg (ref 26.6–33.0)
MCHC: 32.6 g/dL (ref 31.5–35.7)
MCV: 91 fL (ref 79–97)
Monocytes Absolute: 0.4 10*3/uL (ref 0.1–0.9)
Monocytes: 7 %
Neutrophils Absolute: 3 10*3/uL (ref 1.4–7.0)
Neutrophils: 55 %
Platelets: 285 10*3/uL (ref 150–450)
RBC: 4.47 x10E6/uL (ref 3.77–5.28)
RDW: 11.7 % (ref 11.7–15.4)
WBC: 5.5 10*3/uL (ref 3.4–10.8)

## 2021-10-26 LAB — TSH+FREE T4
Free T4: 1.07 ng/dL (ref 0.82–1.77)
TSH: 1.97 u[IU]/mL (ref 0.450–4.500)

## 2021-10-26 LAB — COMPREHENSIVE METABOLIC PANEL
ALT: 14 IU/L (ref 0–32)
AST: 16 IU/L (ref 0–40)
Albumin/Globulin Ratio: 1.8 (ref 1.2–2.2)
Albumin: 4.6 g/dL (ref 3.9–5.0)
Alkaline Phosphatase: 55 IU/L (ref 44–121)
BUN/Creatinine Ratio: 20 (ref 9–23)
BUN: 17 mg/dL (ref 6–20)
Bilirubin Total: 0.5 mg/dL (ref 0.0–1.2)
CO2: 24 mmol/L (ref 20–29)
Calcium: 9.4 mg/dL (ref 8.7–10.2)
Chloride: 102 mmol/L (ref 96–106)
Creatinine, Ser: 0.84 mg/dL (ref 0.57–1.00)
Globulin, Total: 2.5 g/dL (ref 1.5–4.5)
Glucose: 93 mg/dL (ref 70–99)
Potassium: 3.5 mmol/L (ref 3.5–5.2)
Sodium: 138 mmol/L (ref 134–144)
Total Protein: 7.1 g/dL (ref 6.0–8.5)
eGFR: 98 mL/min/{1.73_m2} (ref >59)

## 2021-10-26 LAB — THYROID PEROXIDASE ANTIBODY: Thyroperoxidase Ab SerPl-aCnc: 11 IU/mL (ref 0–34)

## 2021-10-26 LAB — CORTISOL: Cortisol: 19.2 ug/dL

## 2021-10-26 LAB — TESTOSTERONE: Testosterone: 19 ng/dL (ref 13–71)

## 2021-10-26 LAB — VITAMIN D 25 HYDROXY (VIT D DEFICIENCY, FRACTURES): Vit D, 25-Hydroxy: 28.2 ng/mL — ABNORMAL LOW (ref 30.0–100.0)

## 2021-12-29 ENCOUNTER — Other Ambulatory Visit: Payer: Self-pay | Admitting: Family Medicine

## 2022-08-01 ENCOUNTER — Encounter: Payer: Self-pay | Admitting: Family Medicine

## 2022-08-01 ENCOUNTER — Ambulatory Visit: Payer: Managed Care, Other (non HMO) | Admitting: Family Medicine

## 2022-08-01 ENCOUNTER — Ambulatory Visit: Payer: Self-pay | Admitting: *Deleted

## 2022-08-01 VITALS — BP 121/77 | HR 52 | Temp 98.7°F | Resp 16 | Wt >= 6400 oz

## 2022-08-01 DIAGNOSIS — M26622 Arthralgia of left temporomandibular joint: Secondary | ICD-10-CM | POA: Diagnosis not present

## 2022-08-01 NOTE — Telephone Encounter (Signed)
Reason for Disposition  Earache  (Exceptions: brief ear pain of < 60 minutes duration, earache occurring during air travel  Answer Assessment - Initial Assessment Questions 1. LOCATION: "Which ear is involved?"     Left ear is sore.   2. ONSET: "When did the ear start hurting"      This morning Left jaw pain started 3 weeks ago.   Went to dentist. 3. SEVERITY: "How bad is the pain?"  (Scale 1-10; mild, moderate or severe)   - MILD (1-3): doesn't interfere with normal activities    - MODERATE (4-7): interferes with normal activities or awakens from sleep    - SEVERE (8-10): excruciating pain, unable to do any normal activities      moderate 4. URI SYMPTOMS: "Do you have a runny nose or cough?"     No 5. FEVER: "Do you have a fever?" If Yes, ask: "What is your temperature, how was it measured, and when did it start?"     No  6. CAUSE: "Have you been swimming recently?", "How often do you use Q-TIPS?", "Have you had any recent air travel or scuba diving?"     Not asked 7. OTHER SYMPTOMS: "Do you have any other symptoms?" (e.g., headache, stiff neck, dizziness, vomiting, runny nose, decreased hearing)     Sore jaw for 3 weeks.   Went to dentist nothing wrong.   Thinks it's an ear infection. 8. PREGNANCY: "Is there any chance you are pregnant?" "When was your last menstrual period?"     Not that I know of.  Protocols used: Bethann Punches

## 2022-08-01 NOTE — Progress Notes (Signed)
   SUBJECTIVE:   CHIEF COMPLAINT / HPI:   EAR PAIN - has been seen by dentist last week, no dental problems. - declines grinding teeth at night. Duration:  3 weeks Involved ear(s): left and jaw pain Quality:  dull Aggravating factors: chewing, moving jaw up and down. Alleviating factors: reports is better in the mornings.  Fever: no Otorrhea: no Upper respiratory infection symptoms: no Pruritus: no Hearing loss: no Water immersion no Using Q-tips: yes Recurrent otitis media: no Treatments attempted: tylenol   OBJECTIVE:   BP 121/77 (BP Location: Left Arm, Patient Position: Sitting, Cuff Size: Normal)   Pulse (!) 52   Temp 98.7 F (37.1 C) (Oral)   Resp 16   Wt (!) 1498 lb (679.5 kg)   BMI 249.28 kg/m   Gen: well appearing, in NAD HEENT: orophyarynx clear without exudate or erythema. Uvula midline. No tonsillar enlargement. Good dentition. TM visible b/l without bulging, erythema, purulence. No cervical or supraclavicular lymphadenopathy. Mild lateral deviation of L side of mandible at TMJ with associated pain. Slight TTP over L TMJ.    ASSESSMENT/PLAN:   Arthralgia of left temporomandibular joint Tenderness over L TMJ with slight lateral deviation of L mandible at TMJ with opening of jaw with associated pain, without true dislocation. Recommend scheduled NSAIDs for the next week with trial of OTC mouth guard at night. If remains symptomatic after usual conservative care, recommend physical therapy.      Myles Gip, DO

## 2022-08-01 NOTE — Telephone Encounter (Signed)
  Chief Complaint: Left jaw pain and left ear pain Symptoms: Jaw pain for 3 weeks-saw dentist-everything fine.   This morning she noticed left ear is sore Frequency: Left ear sore noticed this morning Pertinent Negatives: Patient denies drainage, URI symptoms or fever. Disposition: '[]'$ ED /'[]'$ Urgent Care (no appt availability in office) / '[x]'$ Appointment(In office/virtual)/ '[]'$  [place] [facility]/ '[]'$ Home Care/ '[]'$ Refused Recommended Disposition /'[]'$ [place] [facility]/ '[]'$  Follow-up with PCP Additional Notes: Appt made for today at 1:00 with Dr. Ky Barban.

## 2022-08-01 NOTE — Assessment & Plan Note (Signed)
Tenderness over L TMJ with slight lateral deviation of L mandible at TMJ with opening of jaw with associated pain, without true dislocation. Recommend scheduled NSAIDs for the next week with trial of OTC mouth guard at night. If remains symptomatic after usual conservative care, recommend physical therapy.

## 2022-10-13 ENCOUNTER — Telehealth: Payer: Self-pay | Admitting: Urgent Care

## 2022-10-13 DIAGNOSIS — B3731 Acute candidiasis of vulva and vagina: Secondary | ICD-10-CM

## 2022-10-13 MED ORDER — FLUCONAZOLE 150 MG PO TABS: 150.0000 mg | ORAL_TABLET | Freq: Every day | ORAL | 0 refills | Status: DC

## 2022-10-13 NOTE — Progress Notes (Signed)

## 2023-01-05 ENCOUNTER — Emergency Department
Admission: EM | Admit: 2023-01-05 | Discharge: 2023-01-05 | Disposition: A | Discharge status: Home / Self Care | Payer: Managed Care, Other (non HMO) | Attending: Emergency Medicine | Admitting: Emergency Medicine

## 2023-01-05 ENCOUNTER — Emergency Department: Payer: Managed Care, Other (non HMO)

## 2023-01-05 ENCOUNTER — Encounter: Payer: Self-pay | Admitting: Emergency Medicine

## 2023-01-05 DIAGNOSIS — O4690 Antepartum hemorrhage, unspecified, unspecified trimester: Secondary | ICD-10-CM

## 2023-01-05 DIAGNOSIS — O3680X0 Pregnancy with inconclusive fetal viability, not applicable or unspecified: Secondary | ICD-10-CM

## 2023-01-05 DIAGNOSIS — O209 Hemorrhage in early pregnancy, unspecified: Secondary | ICD-10-CM | POA: Insufficient documentation

## 2023-01-05 DIAGNOSIS — Z3A08 8 weeks gestation of pregnancy: Secondary | ICD-10-CM | POA: Insufficient documentation

## 2023-01-05 LAB — CBC WITH DIFFERENTIAL/PLATELET
Abs Immature Granulocytes: 0.05 10*3/uL (ref 0.00–0.07)
Basophils Absolute: 0.1 10*3/uL (ref 0.0–0.1)
Basophils Relative: 1 %
Eosinophils Absolute: 0.1 10*3/uL (ref 0.0–0.5)
Eosinophils Relative: 1 %
HCT: 41.6 % (ref 36.0–46.0)
Hemoglobin: 13.7 g/dL (ref 12.0–15.0)
Immature Granulocytes: 1 %
Lymphocytes Relative: 14 %
Lymphs Abs: 1.5 10*3/uL (ref 0.7–4.0)
MCH: 29.7 pg (ref 26.0–34.0)
MCHC: 32.9 g/dL (ref 30.0–36.0)
MCV: 90.2 fL (ref 80.0–100.0)
Monocytes Absolute: 0.6 10*3/uL (ref 0.1–1.0)
Monocytes Relative: 6 %
Neutro Abs: 8.2 10*3/uL — ABNORMAL HIGH (ref 1.7–7.7)
Neutrophils Relative %: 77 %
Platelets: 296 10*3/uL (ref 150–400)
RBC: 4.61 MIL/uL (ref 3.87–5.11)
RDW: 11.4 % — ABNORMAL LOW (ref 11.5–15.5)
WBC: 10.5 10*3/uL (ref 4.0–10.5)
nRBC: 0 % (ref 0.0–0.2)

## 2023-01-05 LAB — BASIC METABOLIC PANEL
Anion gap: 9 (ref 5–15)
BUN: 11 mg/dL (ref 6–20)
CO2: 26 mmol/L (ref 22–32)
Calcium: 9.6 mg/dL (ref 8.9–10.3)
Chloride: 104 mmol/L (ref 98–111)
Creatinine, Ser: 0.67 mg/dL (ref 0.44–1.00)
GFR, Estimated: >60 mL/min (ref >60)
Glucose, Bld: 113 mg/dL — ABNORMAL HIGH (ref 70–99)
Potassium: 3.5 mmol/L (ref 3.5–5.1)
Sodium: 139 mmol/L (ref 135–145)

## 2023-01-05 LAB — HCG, QUANTITATIVE, PREGNANCY: hCG, Beta Chain, Quant, S: 6721 m[IU]/mL — ABNORMAL HIGH (ref <5)

## 2023-01-05 NOTE — ED Notes (Signed)
EDP at bedside. Pt takes lovenox due to clotting abnormalities. First pregnancy. Brown spotting yesterday, bright red bleeding today. Mild cramping. Has not changed pad since around llunch time. Ambulatory with no dizziness.

## 2023-01-05 NOTE — ED Provider Notes (Signed)
[facility] Provider Note    Event Date/Time   First MD Initiated Contact with Patient 01/05/23 1425     (approximate)   History   Chief Complaint Vaginal Bleeding   HPI  Monica Singleton is a 27 y.o. female, G1P0 at approximately 8 weeks of pregnancy who presents to the ED complaining of vaginal bleeding.  Patient reports that she noticed some darkish discharge yesterday evening and has had some intermittent crampy pain in her lower abdomen over the past couple of days.  Over the past 3 hours, she has noticed bright red vaginal bleeding, denies passing any clots and has not had to change a pad in the past 3 hours.  Crampy pain has remained about the same and she denies any fevers, dysuria, flank pain, nausea, or vomiting.  She is scheduled to see MFM at [facility] later this month, has a history of hypercoagulable state and takes Lovenox.     Physical Exam   Triage Vital Signs: ED Triage Vitals  Enc Vitals Group     BP 01/05/23 1421 (!) 149/84     Pulse Rate 01/05/23 1421 70     Resp 01/05/23 1421 16     Temp 01/05/23 1421 98.4 F (36.9 C)     Temp Source 01/05/23 1421 Oral     SpO2 01/05/23 1421 100 %     Weight 01/05/23 1422 145 lb (65.8 kg)     Height 01/05/23 1422 5' 5"$  (1.651 m)     Head Circumference --      Peak Flow --      Pain Score 01/05/23 1422 3     Pain Loc --      Pain Edu? --      Excl. in [place]? --     Most recent vital signs: Vitals:   01/05/23 1421  BP: (!) 149/84  Pulse: 70  Resp: 16  Temp: 98.4 F (36.9 C)  SpO2: 100%    Constitutional: Alert and oriented. Eyes: Conjunctivae are normal. Head: Atraumatic. Nose: No congestion/rhinnorhea. Mouth/Throat: Mucous membranes are moist.  Cardiovascular: Normal rate, regular rhythm. Grossly normal heart sounds.  2+ radial pulses bilaterally. Respiratory: Normal respiratory effort.  No retractions. Lungs CTAB. Gastrointestinal: Soft and nontender. No  distention. Musculoskeletal: No lower extremity tenderness nor edema.  Neurologic:  Normal speech and language. No gross focal neurologic deficits are appreciated.    ED Results / Procedures / Treatments   Labs (all labs ordered are listed, but only abnormal results are displayed) Labs Reviewed  CBC WITH DIFFERENTIAL/PLATELET - Abnormal; Notable for the following components:      Result Value   RDW 11.4 (*)    Neutro Abs 8.2 (*)    All other components within normal limits  BASIC METABOLIC PANEL - Abnormal; Notable for the following components:   Glucose, Bld 113 (*)    All other components within normal limits  HCG, QUANTITATIVE, PREGNANCY - Abnormal; Notable for the following components:   hCG, Beta Chain, Quant, S 6,721 (*)    All other components within normal limits    RADIOLOGY Obstetric ultrasound reviewed and interpreted by me with intrauterine gestational sac but no yolk sac or cardiac activity noted.  PROCEDURES:  Critical Care performed: No  Procedures   MEDICATIONS ORDERED IN ED: Medications - No data to display   IMPRESSION / MDM / [place] / ED COURSE  I reviewed the triage vital signs and the nursing notes.  27 y.o. female G1P0 at approximately 8 weeks of pregnancy with past medical history of hypercoagulable state on Lovenox who presents to the ED complaining of crampy lower abdominal pain along with vaginal bleeding over the past 3 hours.  Patient's presentation is most consistent with acute presentation with potential threat to life or bodily function.  Differential diagnosis includes, but is not limited to, ectopic pregnancy, miscarriage, threatened miscarriage, anemia.  Patient well-appearing and in no acute distress, vital signs are unremarkable.  She has a benign abdominal exam and describes relatively mild bleeding, has not passed any clots or had to change a pad over the past 3 hours.  We will further  assess with CBC, BMP, beta hCG levels, and ultrasound.  Patient currently declines any pain medication.  Labs are unremarkable with stable hemoglobin, no significant leukocytosis, electrolyte abnormality, or AKI.  Beta hCG levels are appropriately elevated and ultrasound shows intrauterine gestational sac with no yolk sac or cardiac activity.  Findings represent pregnancy of unknown anatomic location, minimal bleeding and pain reported by patient on reassessment.  Patient is appropriate for discharge home with OB/GYN follow-up at [facility], was counseled to have follow-up ultrasound within the next 2 weeks and to return to the ED for new or worsening symptoms.  Patient agrees with plan.      FINAL CLINICAL IMPRESSION(S) / ED DIAGNOSES   Final diagnoses:  Vaginal bleeding in pregnancy  Pregnancy of unknown anatomic location     Rx / DC Orders   ED Discharge Orders     None        Note:  This document was prepared using Dragon voice recognition software and may include unintentional dictation errors.   Blake Divine, MD 01/05/23 1755

## 2023-01-05 NOTE — ED Triage Notes (Signed)
Pt reports some vaginal bleeding today, roughly [redacted] weeks pregnant. Pt does take loxenox daily. Endorses some mild cramps which is not abnormal per pt.

## 2023-02-27 NOTE — Progress Notes (Unsigned)
I,J'ya E Janmarie Smoot,acting as a scribe for Jacky Kindle, FNP.,have documented all relevant documentation on the behalf of Jacky Kindle, FNP,as directed by  Jacky Kindle, FNP while in the presence of Jacky Kindle, FNP.   Established patient visit   Patient: Monica Singleton   DOB: 06/26/1996   27 y.o. Female  MRN: [id number] Visit Date: 02/28/2023  Today's healthcare provider: Jacky Kindle, FNP  Re Introduced to nurse practitioner role and practice setting.  All questions answered.  Discussed provider/patient relationship and expectations.  Subjective    Vaginal Injury The patient's primary symptoms include a genital odor and pelvic pain. The patient's pertinent negatives include no missed menses, vaginal bleeding or vaginal discharge. This is a chronic problem. The problem has been gradually worsening. The pain is mild. Associated symptoms include nausea, painful intercourse and urgency. Pertinent negatives include no constipation, discolored urine or dysuria. The symptoms are aggravated by intercourse. She is sexually active. No, her partner does not have an STD. She uses an IUD for contraception. Her menstrual history has been regular. Her past medical history is significant for miscarriage.  Patient reports a Miscarriage In February 2024, an IUD removed in November 2023, and says she was last sexually active two days ago.    Medications: Outpatient Medications Prior to Visit  Medication Sig   rivaroxaban (XARELTO) 10 MG TABS tablet Take one tablet the day before travel, one tablet the day of travel, and one tablet the day after travel.   Ferrous Sulfate (IRON PO) Take 1 tablet by mouth daily.   fluconazole (DIFLUCAN) 150 MG tablet Take 1 tablet (150 mg total) by mouth daily.   No facility-administered medications prior to visit.    Review of Systems  Gastrointestinal:  Positive for nausea. Negative for constipation.  Genitourinary:  Positive for pelvic pain and urgency.  Negative for dysuria, missed menses and vaginal discharge.     Objective    BP 116/67 (BP Location: Right Arm, Patient Position: Sitting, Cuff Size: Normal)   Pulse 66   Temp 98.4 F (36.9 C) (Oral)   Ht 5\' 5"  (1.651 m)   Wt 150 lb 11.2 oz (68.4 kg)   LMP 02/17/2023   BMI 25.08 kg/m   Physical Exam Vitals and nursing note reviewed.  Constitutional:      General: She is not in acute distress.    Appearance: Normal appearance. She is normal weight. She is not ill-appearing, toxic-appearing or diaphoretic.  HENT:     Head: Normocephalic and atraumatic.  Cardiovascular:     Rate and Rhythm: Normal rate and regular rhythm.     Pulses: Normal pulses.     Heart sounds: Normal heart sounds. No murmur heard.    No friction rub. No gallop.  Pulmonary:     Effort: Pulmonary effort is normal. No respiratory distress.     Breath sounds: Normal breath sounds. No stridor. No wheezing, rhonchi or rales.  Chest:     Chest wall: No tenderness.  Abdominal:     General: Bowel sounds are normal.     Palpations: Abdomen is soft.     Tenderness: There is no abdominal tenderness. There is no right CVA tenderness, left CVA tenderness or guarding.  Musculoskeletal:        General: No swelling, tenderness, deformity or signs of injury. Normal range of motion.     Right lower leg: No edema.     Left lower leg: No edema.  Skin:  General: Skin is warm and dry.     Capillary Refill: Capillary refill takes less than 2 seconds.     Coloration: Skin is not jaundiced or pale.     Findings: No bruising, erythema, lesion or rash.  Neurological:     General: No focal deficit present.     Mental Status: She is alert and oriented to person, place, and time. Mental status is at baseline.     Cranial Nerves: No cranial nerve deficit.     Sensory: No sensory deficit.     Motor: No weakness.     Coordination: Coordination normal.  Psychiatric:        Mood and Affect: Mood normal.        Behavior: Behavior  normal.        Thought Content: Thought content normal.        Judgment: Judgment normal.     No results found for any visits on 02/28/23.  Assessment & Plan     Problem List Items Addressed This Visit       Genitourinary   Bacterial vaginitis    Patient endorses change in vaginal order following increase in sexual activity as well as miscarriage following IUD removal; defers exam Endorses amine odor often associated with BV and vaginal irritation which 'resets' following decrease in sexual activity or menses  Denies other STI testing at this time given 1 partner (husband) and actively trying to conceive Aptiva swab self collected; will start treatment at this time      Relevant Medications   metroNIDAZOLE (FLAGYL) 500 MG tablet     Other   Vaginal discharge - Primary    Acute, self limiting Wishes to seek treatment given concern for odor and irritation Presumed BV given description; recommend swab and start of ABX Patient advised of ETOH risk with use of ABX      Relevant Orders   Cervicovaginal ancillary only   Return if symptoms worsen or fail to improve.     Leilani Merl, FNP, have reviewed all documentation for this visit. The documentation on 02/28/23 for the exam, diagnosis, procedures, and orders are all accurate and complete.  Jacky Kindle, FNP  [facility] [phone] (phone) [phone] (fax)  [facility]

## 2023-02-28 ENCOUNTER — Ambulatory Visit: Payer: Managed Care, Other (non HMO) | Admitting: Family Medicine

## 2023-02-28 ENCOUNTER — Encounter: Payer: Self-pay | Admitting: Family Medicine

## 2023-02-28 ENCOUNTER — Other Ambulatory Visit (HOSPITAL_COMMUNITY)
Admission: RE | Admit: 2023-02-28 | Discharge: 2023-02-28 | Disposition: A | Discharge status: Home / Self Care | Payer: Managed Care, Other (non HMO) | Source: Ambulatory Visit | Attending: Family Medicine | Admitting: Family Medicine

## 2023-02-28 VITALS — BP 116/67 | HR 66 | Temp 98.4°F | Ht 65.0 in | Wt 150.7 lb

## 2023-02-28 DIAGNOSIS — N898 Other specified noninflammatory disorders of vagina: Secondary | ICD-10-CM | POA: Insufficient documentation

## 2023-02-28 DIAGNOSIS — B9689 Other specified bacterial agents as the cause of diseases classified elsewhere: Secondary | ICD-10-CM

## 2023-02-28 DIAGNOSIS — N760 Acute vaginitis: Secondary | ICD-10-CM

## 2023-02-28 MED ORDER — METRONIDAZOLE 500 MG PO TABS: 500.0000 mg | ORAL_TABLET | Freq: Two times a day (BID) | ORAL | 0 refills | Status: AC

## 2023-02-28 NOTE — Assessment & Plan Note (Signed)
Acute, self limiting Wishes to seek treatment given concern for odor and irritation Presumed BV given description; recommend swab and start of ABX Patient advised of ETOH risk with use of ABX

## 2023-02-28 NOTE — Assessment & Plan Note (Signed)
Patient endorses change in vaginal order following increase in sexual activity as well as miscarriage following IUD removal; defers exam Endorses amine odor often associated with BV and vaginal irritation which 'resets' following decrease in sexual activity or menses  Denies other STI testing at this time given 1 partner (husband) and actively trying to conceive Aptiva swab self collected; will start treatment at this time

## 2023-03-01 ENCOUNTER — Telehealth: Payer: Self-pay | Admitting: Family Medicine

## 2023-03-01 LAB — CERVICOVAGINAL ANCILLARY ONLY
Bacterial Vaginitis (gardnerella): NEGATIVE
Candida Glabrata: NEGATIVE
Candida Vaginitis: NEGATIVE
Chlamydia: NEGATIVE
Comment: NEGATIVE
Comment: NEGATIVE
Comment: NEGATIVE
Comment: NEGATIVE
Comment: NEGATIVE
Comment: NORMAL
Neisseria Gonorrhea: NEGATIVE
Trichomonas: NEGATIVE

## 2023-03-01 NOTE — Telephone Encounter (Signed)
Pt stated she was prescribed metroNIDAZOLE (FLAGYL) 500 MG tablets; however, she doesn't feel comfortable taking the tablets and would like gel instead.  She stated she did pickup tablets but did research and did not feel comfortable taking them.   [id number] [place], [place] - [address] AT [facility] OF [address]  [address] [place] [address]  Phone: [phone] Fax: [phone]  Hours: Not open 24 hours    Please advise.

## 2023-03-01 NOTE — Telephone Encounter (Signed)
Will forward to prescriber

## 2023-03-03 ENCOUNTER — Other Ambulatory Visit: Payer: Self-pay | Admitting: Family Medicine

## 2023-03-03 MED ORDER — METRONIDAZOLE 0.75 % EX GEL: 1.0000 | Freq: Every day | CUTANEOUS | 0 refills | Status: AC

## 2023-03-03 NOTE — Progress Notes (Signed)
Normal/negative for BV or other STIs. However, metro gel was called in per your request vs tablets. Recommend pelvic exam if symptoms continue with PCP or with OBGYN team.

## 2023-04-07 ENCOUNTER — Telehealth: Payer: Managed Care, Other (non HMO) | Admitting: Family Medicine

## 2023-04-07 DIAGNOSIS — L255 Unspecified contact dermatitis due to plants, except food: Secondary | ICD-10-CM

## 2023-04-07 MED ORDER — PREDNISONE 10 MG (21) PO TBPK: ORAL_TABLET | ORAL | 0 refills | Status: DC

## 2023-04-07 NOTE — Progress Notes (Signed)
E Visit for Rash  We are sorry that you are not feeling well. Here is how we plan to help!  Based on what you shared with me it looks like you have contact dermatitis.  Contact dermatitis is a skin rash caused by something that touches the skin and causes irritation or inflammation.  Your skin may be red, swollen, dry, cracked, and itch.  The rash should go away in a few days but can last a few weeks.  If you get a rash, it's important to figure out what caused it so the irritant can be avoided in the future.   Prednisone 10 mg daily for 6 days (see taper instructions below)     HOME CARE:  Take cool showers and avoid direct sunlight. Apply cool compress or wet dressings. Take a bath in an oatmeal bath.  Sprinkle content of one Aveeno packet under running faucet with comfortably warm water.  Bathe for 15-20 minutes, 1-2 times daily.  Pat dry with a towel. Do not rub the rash. Use hydrocortisone cream. Take an antihistamine like Benadryl for widespread rashes that itch.  The adult dose of Benadryl is 25-50 mg by mouth 4 times daily. Caution:  This type of medication may cause sleepiness.  Do not drink alcohol, drive, or operate dangerous machinery while taking antihistamines.  Do not take these medications if you have prostate enlargement.  Read package instructions thoroughly on all medications that you take.  GET HELP RIGHT AWAY IF:  Symptoms don't go away after treatment. Severe itching that persists. If you rash spreads or swells. If you rash begins to smell. If it blisters and opens or develops a yellow-brown crust. You develop a fever. You have a sore throat. You become short of breath.  MAKE SURE YOU:  Understand these instructions. Will watch your condition. Will get help right away if you are not doing well or get worse.  Thank you for choosing an e-visit.  Your e-visit answers were reviewed by a board certified advanced clinical practitioner to complete your personal  care plan. Depending upon the condition, your plan could have included both over the counter or prescription medications.  Please review your pharmacy choice. Make sure the pharmacy is open so you can pick up prescription now. If there is a problem, you may contact your provider through MyChart messaging and have the prescription routed to another pharmacy.  Your safety is important to us. If you have drug allergies check your prescription carefully.   For the next 24 hours you can use MyChart to ask questions about today's visit, request a non-urgent call back, or ask for a work or school excuse. You will get an email in the next two days asking about your experience. I hope that your e-visit has been valuable and will speed your recovery.    have provided 5 minutes of non face to face time during this encounter for chart review and documentation.   

## 2023-05-03 ENCOUNTER — Other Ambulatory Visit: Payer: Self-pay | Admitting: Family Medicine

## 2023-05-03 ENCOUNTER — Telehealth: Payer: Self-pay

## 2023-05-03 DIAGNOSIS — Z3201 Encounter for pregnancy test, result positive: Secondary | ICD-10-CM

## 2023-05-03 NOTE — Telephone Encounter (Signed)
Patient advised. Verbalized understanding

## 2023-05-03 NOTE — Telephone Encounter (Signed)
Copied from CRM [id number]. Topic: Appointment Scheduling - Scheduling Inquiry for Clinic >> May 03, 2023  9:01 AM Monica Singleton wrote: Reason for CRM: pt called saying she wanted to get HCG beta test done.  She had a miscarriage in Feb.  She just had a positive test for pregnancy and called her obgyn and they told her to call her primary.  Does he need an appt with the provider or can the just  labs be ordered.  CB#  [phone]

## 2023-05-08 LAB — HCG, SERUM, QUALITATIVE

## 2023-05-08 LAB — BETA HCG QUANT (REF LAB): hCG Quant: 4926 m[IU]/mL

## 2023-05-08 NOTE — Progress Notes (Signed)
Pregnancy confirmed; hCG quat of ~5000k which places you at minimum of [redacted] weeks gestation. Congrats!

## 2023-06-22 LAB — PANORAMA PRENATAL TEST FULL PANEL:PANORAMA TEST PLUS 5 ADDITIONAL MICRODELETIONS: FETAL FRACTION: 7.4

## 2023-08-04 ENCOUNTER — Telehealth: Payer: Managed Care, Other (non HMO) | Admitting: Physician Assistant

## 2023-08-04 DIAGNOSIS — O2342 Unspecified infection of urinary tract in pregnancy, second trimester: Secondary | ICD-10-CM | POA: Diagnosis not present

## 2023-08-04 DIAGNOSIS — Z3A17 17 weeks gestation of pregnancy: Secondary | ICD-10-CM

## 2023-08-05 MED ORDER — CEPHALEXIN 500 MG PO CAPS
500.0000 mg | ORAL_CAPSULE | Freq: Two times a day (BID) | ORAL | 0 refills | Status: DC
Start: 2023-08-05 — End: 2024-08-16

## 2023-08-05 NOTE — Progress Notes (Signed)

## 2023-09-15 IMAGING — US US BREAST*R* LIMITED INC AXILLA
1 series · 7 of 7 positions shown · non-contrast
Comparison: None.

CLINICAL DATA: RIGHT inner breast pain

EXAM:
ULTRASOUND OF THE RIGHT BREAST

[Series 1: us breast*right* limited inc axilla · 0.06mm/px · 7 of 7 slices shown]
[im 1/7]
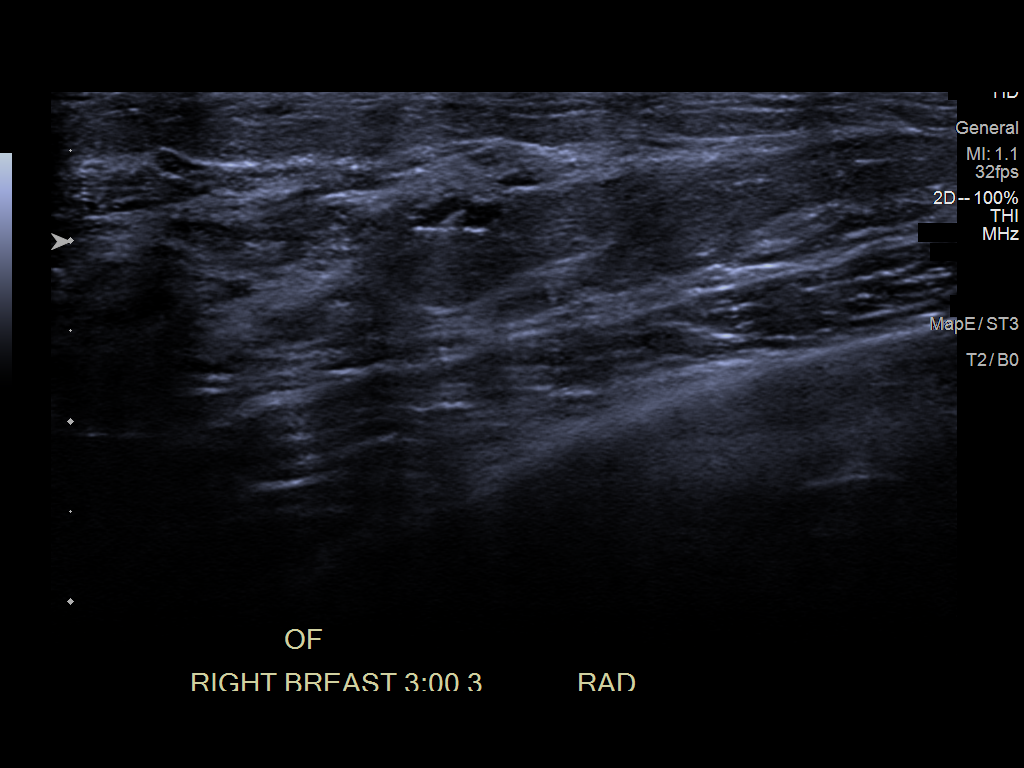
[im 2/7]
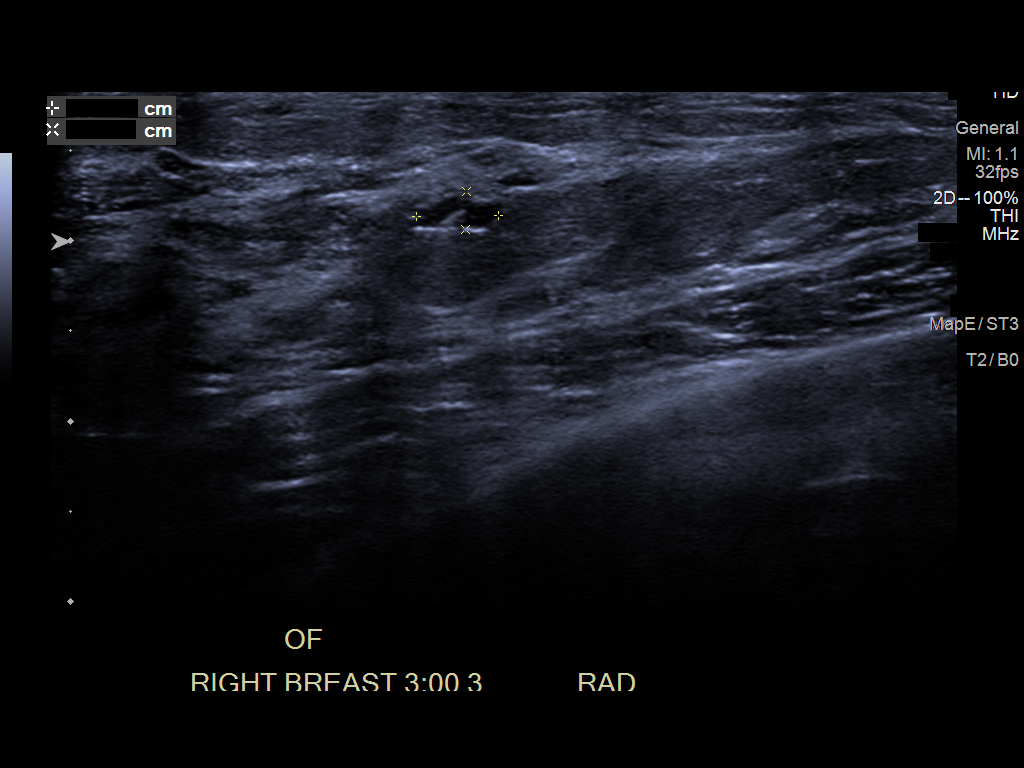
[im 3/7]
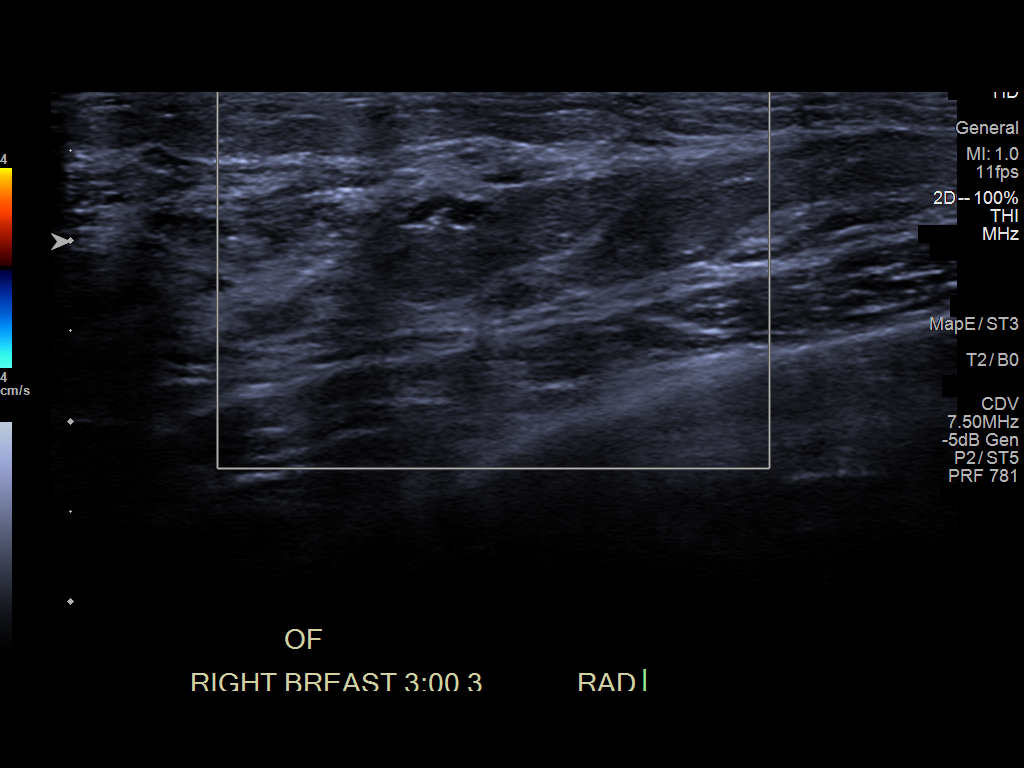
[im 4/7]
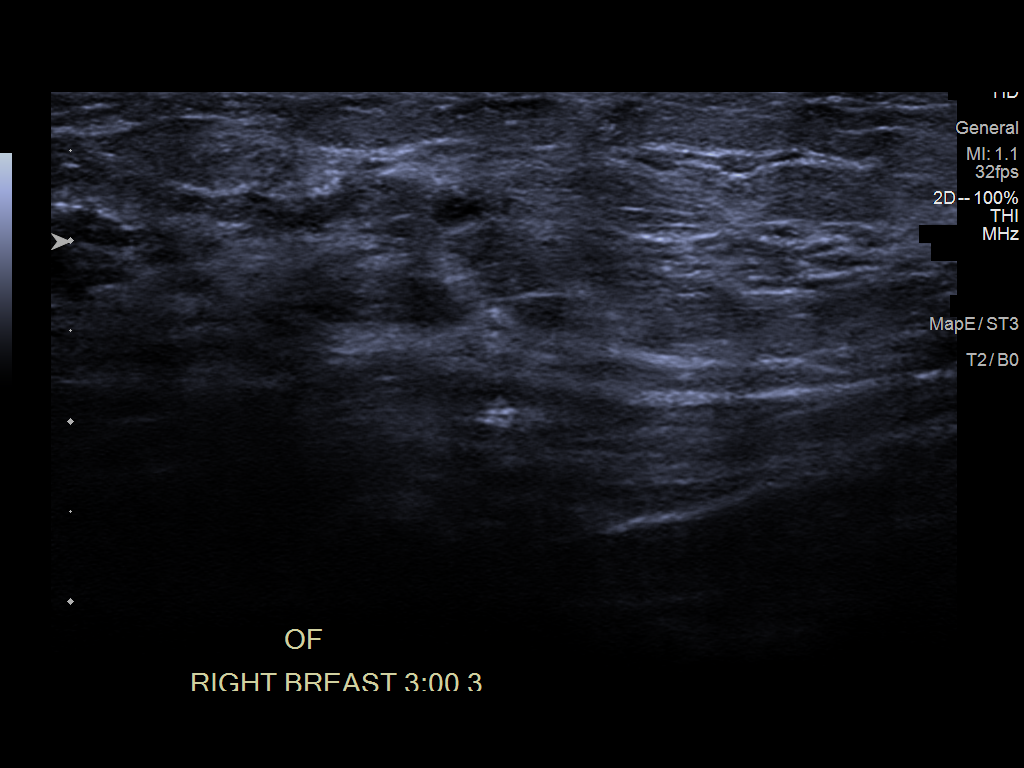
[im 5/7]
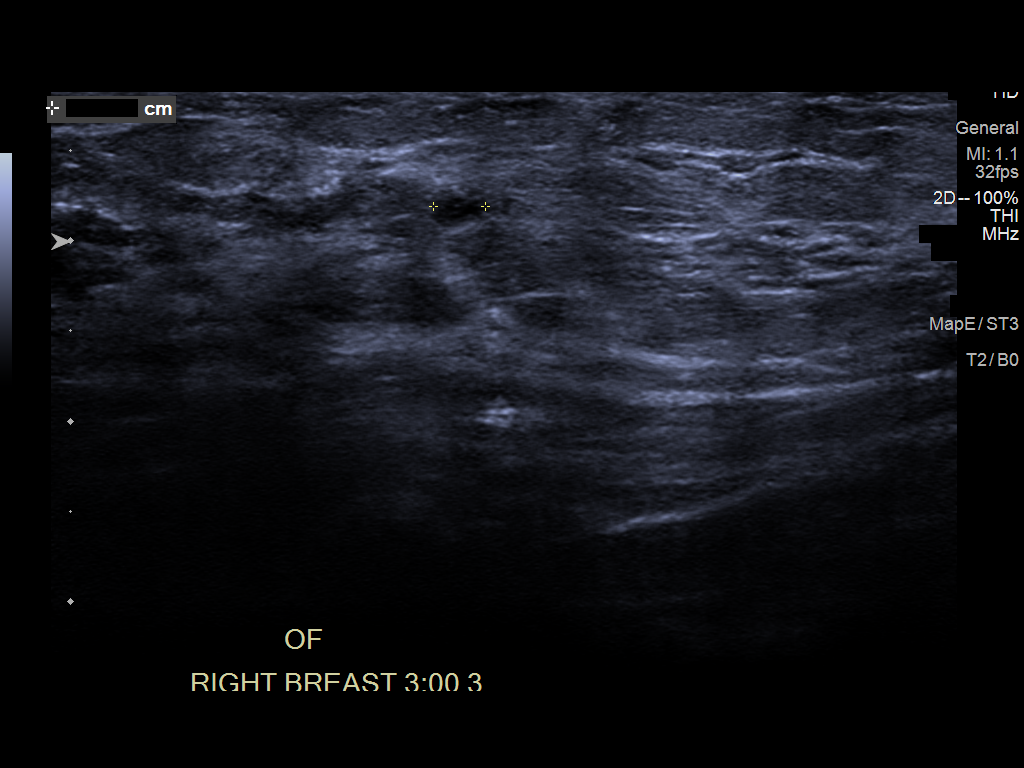
[im 6/7]
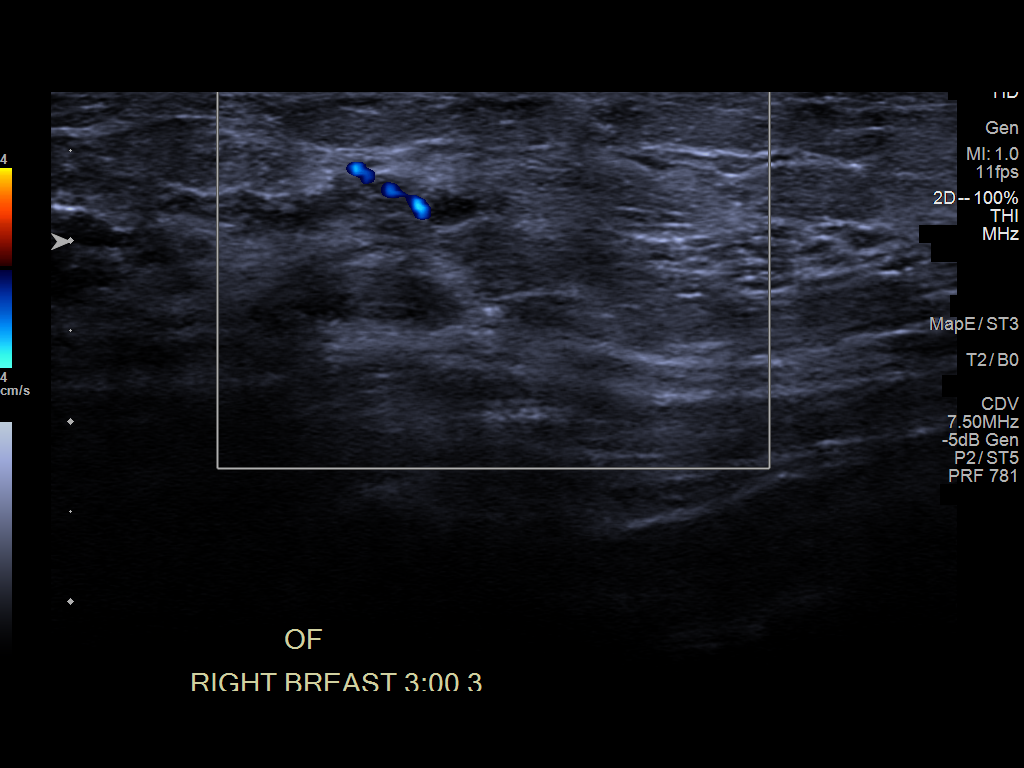
[im 7/7]
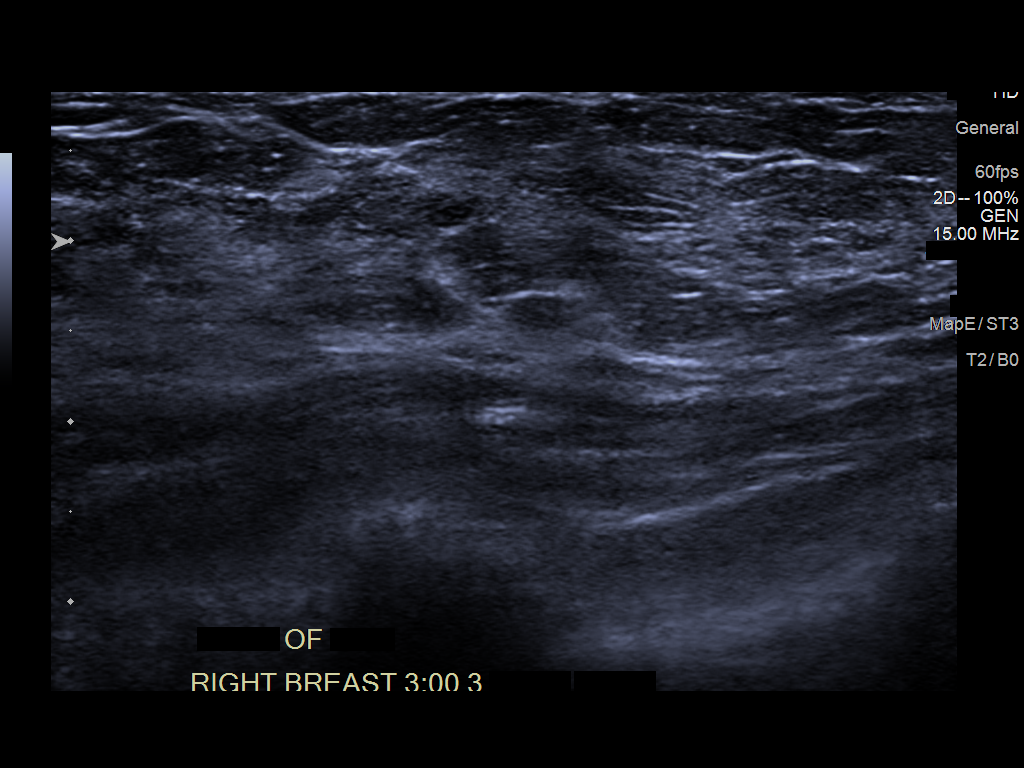

[7 of 7 positions shown; findings below may reference images not displayed]

FINDINGS: On physical exam, no suspicious mass is appreciated.

Targeted ultrasound was performed of the site of most painful
concern in the RIGHT inner breast. No suspicious cystic or solid
mass is seen. At 3 o'clock 3 cm from the nipple, there is incidental
sonographic note of an oval circumscribed anechoic mass with
posterior acoustic enhancement and a thin internal septation. It
measures 5 x 2 x 3 mm and is consistent with a benign cluster of
cysts.
IMPRESSION: 1. There is a benign subcentimeter cluster cysts in the region of
painful concern in the RIGHT breast. It is unclear whether this is
related to patient's symptoms. No sonographic evidence of
malignancy. Any further workup of the patient's symptoms should be
based on the clinical assessment. Recommend routine annual screening
mammogram at the age of 40.

RECOMMENDATION:
Screening mammogram at age 40 unless there are persistent or
intervening clinical concerns. (Code:[id number]-D-1KP)

I have discussed the findings and recommendations with the patient.
If applicable, a reminder letter will be sent to the patient
regarding the next appointment.

BI-RADS CATEGORY  2: Benign.

## 2024-08-15 ENCOUNTER — Telehealth: Admitting: Family

## 2024-08-15 DIAGNOSIS — B009 Herpesviral infection, unspecified: Secondary | ICD-10-CM | POA: Diagnosis not present

## 2024-08-16 MED ORDER — VALACYCLOVIR HCL 1 G PO TABS: 2000.0000 mg | ORAL_TABLET | Freq: Two times a day (BID) | ORAL | 0 refills | Status: AC

## 2024-08-16 NOTE — Progress Notes (Signed)
We are sorry that you are not feeling well.  Here is how we plan to help!  Based on what you have shared with me it looks like you have a Cold Sore. A cold sore (fever blister) is a skin infection caused by the herpes simplex virus (HSV-1). HSV-1 is closely related to the virus that causes genital herpes (HSV-2), but they are not the same even though both viruses can cause oral and genital infections. Cold sores are small, fluid-filled sores inside of the mouth or on the lips, gums, nose, chin, cheeks, or fingers.   The herpes simplex virus can be easily passed (contagious) to other people through close personal contact, such as kissing or sharing personal items. The virus can also spread to other parts of the body, such as the eyes or genitals. Cold sores are contagious until the sores crust over completely. They often heal within 2 weeks. Once a person is infected, the herpes simplex virus remains permanently in the body. Therefore, there is no cure for cold sores, and they often recur when a person is tired, stressed, sick, or gets too much sun. Additional factors that can cause a recurrence include hormone changes in menstruation or pregnancy, certain drugs, and cold weather.  CAUSES  Cold sores are caused by the herpes simplex virus. The virus is spread from person to person through close contact, such as through kissing, touching the affected area, or sharing personal items such as lip balm, razors, or eating utensils.   I have sent in a prescription to your pharmacy of Valacyclovir, or Valtrex, 2000 mg twice a day for 1 day.   HOME CARE INSTRUCTIONS  Only take over-the-counter or prescription medicines for pain, discomfort, or fever as directed by your caregiver. Do not use aspirin.   Use a cotton-tip swab to apply creams or gels to your sores.   Do not touch the sores or pick the scabs. Wash your hands often. Do not touch your eyes without washing your hands first.   Avoid kissing, oral sex,  and sharing personal items until sores heal.   Apply an ice pack on your sores for 10-15 minutes to ease any discomfort.   Avoid hot, cold, or salty foods because they may hurt your mouth. Eat a soft, bland diet to avoid irritating the sores. Use a straw to drink if you have pain when drinking out of a glass.   Keep sores clean and dry to prevent an infection of other tissues.   Avoid the sun and limit stress if these things trigger outbreaks. If sun causes cold sores, apply sunscreen on the lips before being out in the sun.   SEEK MEDICAL CARE IF:  You have a fever or persistent symptoms for more than 2-3 days.   You have a fever and your symptoms suddenly get worse.   You have pus, not clear fluid, coming from the sores.   You have redness that is spreading.   You have pain or irritation in your eye.   You get sores on your genitals.   Your sores do not heal within 2 weeks.   You have a weakened immune system.   You have frequent recurrences of cold sores.     MAKE SURE YOU  Understand these instructions. Will watch your condition. Will get help right away if you are not doing well or get worse.  Your e-visit answers were reviewed by a board certified advanced clinical practitioner to complete your personal care plan.    personal care plan.  Depending on the condition, your plan could have included both over the counter or prescription medications.  If there is a problem please reply  once you have received a response from your provider.  Your safety is important to us.  If you have drug allergies check your prescription carefully.    You can use MyChart to ask questions about today's visit, request a non-urgent call back, or ask for a work or school excuse.  You will get an e-mail in the next two days asking about your experience.  I hope that your e-visit has been valuable and will speed your recovery. Thank you for using e-visits.     

## 2024-08-16 NOTE — Progress Notes (Signed)
Approximately 5 minutes was spent documenting and reviewing patient's chart.

## 2024-10-29 ENCOUNTER — Emergency Department

## 2024-10-29 ENCOUNTER — Emergency Department
Admission: EM | Admit: 2024-10-29 | Discharge: 2024-10-29 | Disposition: A | Discharge status: Home / Self Care | Attending: Emergency Medicine | Admitting: Emergency Medicine

## 2024-10-29 ENCOUNTER — Other Ambulatory Visit: Payer: Self-pay

## 2024-10-29 DIAGNOSIS — N939 Abnormal uterine and vaginal bleeding, unspecified: Secondary | ICD-10-CM

## 2024-10-29 DIAGNOSIS — O26899 Other specified pregnancy related conditions, unspecified trimester: Secondary | ICD-10-CM | POA: Diagnosis present

## 2024-10-29 DIAGNOSIS — O26859 Spotting complicating pregnancy, unspecified trimester: Secondary | ICD-10-CM | POA: Diagnosis not present

## 2024-10-29 LAB — HEPATIC FUNCTION PANEL
ALT: 12 U/L (ref 0–44)
AST: 20 U/L (ref 15–41)
Albumin: 4.8 g/dL (ref 3.5–5.0)
Alkaline Phosphatase: 76 U/L (ref 38–126)
Bilirubin, Direct: 0.1 mg/dL (ref 0.0–0.2)
Indirect Bilirubin: 0.2 mg/dL — ABNORMAL LOW (ref 0.3–0.9)
Total Bilirubin: 0.3 mg/dL (ref 0.0–1.2)
Total Protein: 7.8 g/dL (ref 6.5–8.1)

## 2024-10-29 LAB — CBC WITH DIFFERENTIAL/PLATELET
Abs Immature Granulocytes: 0.03 K/uL (ref 0.00–0.07)
Basophils Absolute: 0.1 K/uL (ref 0.0–0.1)
Basophils Relative: 1 %
Eosinophils Absolute: 0.3 K/uL (ref 0.0–0.5)
Eosinophils Relative: 3 %
HCT: 40.3 % (ref 36.0–46.0)
Hemoglobin: 13.5 g/dL (ref 12.0–15.0)
Immature Granulocytes: 0 %
Lymphocytes Relative: 35 %
Lymphs Abs: 3.2 K/uL (ref 0.7–4.0)
MCH: 29.9 pg (ref 26.0–34.0)
MCHC: 33.5 g/dL (ref 30.0–36.0)
MCV: 89.4 fL (ref 80.0–100.0)
Monocytes Absolute: 0.7 K/uL (ref 0.1–1.0)
Monocytes Relative: 8 %
Neutro Abs: 4.9 K/uL (ref 1.7–7.7)
Neutrophils Relative %: 53 %
Platelets: 339 K/uL (ref 150–400)
RBC: 4.51 MIL/uL (ref 3.87–5.11)
RDW: 11.6 % (ref 11.5–15.5)
WBC: 9.2 K/uL (ref 4.0–10.5)
nRBC: 0 % (ref 0.0–0.2)

## 2024-10-29 LAB — BASIC METABOLIC PANEL WITH GFR
Anion gap: 12 (ref 5–15)
BUN: 18 mg/dL (ref 6–20)
CO2: 27 mmol/L (ref 22–32)
Calcium: 9.1 mg/dL (ref 8.9–10.3)
Chloride: 101 mmol/L (ref 98–111)
Creatinine, Ser: 0.71 mg/dL (ref 0.44–1.00)
GFR, Estimated: >60 mL/min (ref >60)
Glucose, Bld: 106 mg/dL — ABNORMAL HIGH (ref 70–99)
Potassium: 3.7 mmol/L (ref 3.5–5.1)
Sodium: 139 mmol/L (ref 135–145)

## 2024-10-29 LAB — HCG, QUANTITATIVE, PREGNANCY: hCG, Beta Chain, Quant, S: 335 m[IU]/mL — ABNORMAL HIGH (ref <5)

## 2024-10-29 MED ORDER — METHOTREXATE FOR ECTOPIC PREGNANCY
50.0000 mg/m2 | Freq: Once | INTRAMUSCULAR | Status: AC
  Administered 2024-10-29: 87.5 mg via INTRAMUSCULAR
  Filled 2024-10-29: qty 3.5

## 2024-10-29 NOTE — ED Notes (Signed)
 Dr. Jean Rosenthal at bedside

## 2024-10-29 NOTE — Consult Note (Addendum)
 GYNECOLOGY CONSULT NOTE  GYN Consultation  Attending Provider: Dorothyann Drivers, MD   Monica Singleton [id number] 10/29/2024 7:02 AM    Reason for Consultation:   Monica Singleton is a 28 y.o. G1P1001 female seen at the request of Dr. Belva for evaluation of abdominal pain in the setting of a positive pregnancy test.    History of Present Ilness:   The patient is approximately 10 months postpartum from a vaginal delivery at [facility].  She is currently exclusively breast-feeding.  She has a medical history notable for both factor II gene mutation and factor V Leiden.  She is otherwise healthy.  She has had irregular periods due to her breast-feeding status.  Her last remembered period was October 8.  In the past couple of weeks she has noticed some lower abdominal/uterine cramping.  Especially in the past week her cramping has become worse and she has noticed some mild spotting.  Overnight her pain became significantly worse reaching an 8 out of 10.  So, she came to the ER.  She states that her pain is still fairly significant for her.  Alleviating factors: Lying down and heating pad.  Aggravating factors: Sitting down.  Associated symptoms: None.  She can think of nothing that she can do otherwise to bring about the pain or make it worse.  She denies a history of sexually-transmitted infections, PID, ectopic pregnancy, pelvic surgery.  She began having symptoms of a possible pregnancy recently and had an hCG checked 2 times prior to today.  The level did not change significantly.  She reports a level of about 340 and then 2 days later about 350.  Her level in the ER today is 335.  Her blood type is O+.  Past Medical History:  Diagnosis Date   Allergy    Clotting disorder    Factor II deficiency (HCC)    Factor V Leiden    Past Surgical History:  Procedure Laterality Date   EYE SURGERY     WISDOM TOOTH EXTRACTION  03/2020   Allergies[1] NKDA  Prior to Admission  medications  Denies    Gynecologic History:    Social History:  She  reports that she has never smoked. She has been exposed to tobacco smoke. She has never used smokeless tobacco. She reports current alcohol use of about 6.0 standard drinks of alcohol per week. She reports that she does not use drugs.  Family History:  family history includes Arthritis in her paternal grandmother; Cataracts in her paternal grandmother; Clotting disorder in her paternal grandmother; Depression in her mother; Factor V Leiden deficiency in her father; Skin cancer in her father.   Review of Systems  Constitutional: Negative.   HENT: Negative.    Eyes: Negative.   Respiratory: Negative.    Cardiovascular: Negative.   Gastrointestinal:  Positive for abdominal pain. Negative for nausea and vomiting.  Genitourinary: Negative.        Scant vaginal spotting  Musculoskeletal: Negative.   Skin: Negative.   Neurological: Negative.   Psychiatric/Behavioral: Negative.       Objective    BP 126/88 (BP Location: Right Arm)   Pulse 76   Temp 98 F (36.7 C) (Oral)   Resp 16   Ht 5' 5 (1.651 m)   Wt 65.8 kg   LMP  (LMP Unknown)   SpO2 97%   BMI 24.13 kg/m  Physical Exam Constitutional:      General: She is not in acute distress.    Appearance: Normal appearance.  She is well-developed.  Genitourinary:     Genitourinary Comments: Deferred  HENT:     Head: Normocephalic and atraumatic.  Eyes:     General: No scleral icterus.    Conjunctiva/sclera: Conjunctivae normal.  Cardiovascular:     Rate and Rhythm: Normal rate and regular rhythm.     Heart sounds: No murmur heard.    No friction rub. No gallop.  Pulmonary:     Effort: Pulmonary effort is normal. No respiratory distress.     Breath sounds: Normal breath sounds. No wheezing or rales.  Abdominal:     General: Bowel sounds are normal. There is no distension.     Palpations: Abdomen is soft. There is no mass.     Tenderness: There is abdominal  tenderness (mostly in LLQ, some radiation of pain when pressing in RLQ/flank). There is no guarding or rebound.  Musculoskeletal:        General: Normal range of motion.     Cervical back: Normal range of motion and neck supple.  Neurological:     General: No focal deficit present.     Mental Status: She is alert and oriented to person, place, and time.     Cranial Nerves: No cranial nerve deficit.  Skin:    General: Skin is warm and dry.     Findings: No erythema.  Psychiatric:        Mood and Affect: Mood normal.        Behavior: Behavior normal.        Judgment: Judgment normal.     Laboratory Results:   Lab Results  Component Value Date   WBC 9.2 10/29/2024   RBC 4.51 10/29/2024   HGB 13.5 10/29/2024   HCT 40.3 10/29/2024   PLT 339 10/29/2024   NA 139 10/29/2024   K 3.7 10/29/2024   CREATININE 0.71 10/29/2024   hCG today 335  Imaging Results US  OB LESS THAN 14 WEEKS WITH OB TRANSVAGINAL Result Date: 10/29/2024 CLINICAL DATA:  Vaginal bleeding.  Positive pregnancy test. EXAM: OBSTETRIC <14 WK US  AND TRANSVAGINAL OB US  TECHNIQUE: Both transabdominal and transvaginal ultrasound examinations were performed for complete evaluation of the gestation as well as the maternal uterus, adnexal regions, and pelvic cul-de-sac. Transvaginal technique was performed to assess early pregnancy. COMPARISON:  None Available. FINDINGS: Intrauterine gestational sac: None Yolk sac:  Not Visualized. Embryo:  Not Visualized. Cardiac Activity: Not Visualized. Heart Rate: N/A  bpm MSD: N/A CRL:  N/A Subchorionic hemorrhage:  None visualized. Maternal uterus/adnexae: Right ovary measures 4.5 x 2.9 x 3.2 cm. 2.8 cm simple cystic structure within the parenchyma is compatible with a dominant follicle or potentially corpus luteum cyst. No substantial peripheral hyperemia or internal architecture associated with a cystic structure to raise concern for ectopic pregnancy. The left ovary measures 4.1 x 1.4 x 1.9  cm. No left adnexal mass. Trace free fluid is seen in the pelvis. IMPRESSION: No intrauterine gestational sac identified. No overtly suspicious adnexal mass for ectopic gestation. Given the history of a positive pregnancy test, differential considerations include intrauterine gestation too early to visualize, completed abortion, or nonvisualized ectopic pregnancy. Close clinical correlation is recommended with serial beta-hCG and followup ultrasound as warranted. Electronically Signed   By: Camellia Candle M.D.   On: 10/29/2024 05:05      Assessment & Recommendations  Pregnancy of unknown anatomic location  Monica Singleton is a 28 y.o. G28P1001  female with abdominal pain in the setting of early pregnancy.  Given her  overall presentation, her findings are most consistent with failed early pregnancy.  We discussed that the failed early pregnancy could be intrauterine with an hCG level too low to be visualized on ultrasound.  Alternatively, this could represent an ectopic pregnancy.  We discussed that ectopic pregnancies are not always visualized on ultrasound and that based on the clinical scenario we decide how to proceed.  We discussed 3 options for proceeding.  The first option would be for her to follow-up with her regular OB/GYN first thing later today for recommendations and follow-up.  The second option would be to receive methotrexate for the ectopic pregnancy protocol.  The third option would be for surgery.  Given her lack of significant findings and nonacute abdomen, as well as no significant fluid in her abdomen, a surgery may not reveal a pregnancy or surgical target.  After discussion, a patient centered shared decision making process was utilized to decide on use of methotrexate.  She is currently breast-feeding exclusively.  I recommended she do not get breastmilk for 1 week after injection.  She should also avoid NSAIDs.  Standard precautions were given and follow-up instructions given for  following her hCG levels at regular intervals.  We have her baseline.  In 4 days we will recheck again and in 7 days check hCG level.  Will compare day 4 and day 7 levels and we would hope for a drop of 15% or more.  We did discuss that the methotrexate injection does not have a 100% success rate and that there is a chance of failure.  We discussed precautions such as worsening abdominal pain, worsening vaginal bleeding or any other concerning symptoms.  She will be provided a handout on side effects of methotrexate.  We discussed that there could be a slight increase in pain around day 4.  However, severe increase in pain should not be ignored.  All questions answered at this time.  She voiced her ability to be able to follow the protocol for making sure that she has responded well to the methotrexate injection.  Will have her follow-up at [facility] with me in 1 week.  Garnette Mace, MD 10/29/2024 7:02 AM       [1]  Allergies Allergen Reactions   Nickel Rash

## 2024-10-29 NOTE — Discharge Instructions (Signed)
 Avoid NSAIDS for the next 1 week.  Avoid breast-feeding for 1 week. Please follow-up with Dr. Leonce in 1 week.  His information is provided.  Return to the emergency department for any worsening abdominal pain, lightheadedness/dizziness, or any other symptom personally concerning to yourself.

## 2024-10-29 NOTE — ED Notes (Signed)
 Pt sts unable to provide urine sample at this time. Has urine specimen up at bedside

## 2024-10-29 NOTE — ED Provider Notes (Signed)
 [facility] Provider Note    Event Date/Time   First MD Initiated Contact with Patient 10/29/24 [phone]     (approximate)  History   Chief Complaint: Abdominal Pain  HPI  Monica Singleton is a 28 y.o. female with a past medical history of factor V Leiden, status post delivery 10 months ago presents to the emergency department for lower abdominal discomfort vaginal spotting with a positive pregnancy test.  According to the patient for the last week or so she has had some slight vaginal spotting and intermittent lower abdominal cramping.  Patient had a positive pregnancy test when saw her doctor had 2 beta-hCG's that were not doubling but were both elevated in the 300s.  Patient states the pain has worsened more so in the left lower quadrant, patient was concerned so she came to the emergency department for evaluation.  Patient denies any worsening of the spotting/bleeding.  Patient is an O+ blood type.  Physical Exam   Triage Vital Signs: ED Triage Vitals  Encounter Vitals Group     BP 10/29/24 0239 (!) 146/119     Girls Systolic BP Percentile --      Girls Diastolic BP Percentile --      Boys Systolic BP Percentile --      Boys Diastolic BP Percentile --      Pulse Rate 10/29/24 0239 84     Resp 10/29/24 0239 18     Temp 10/29/24 0239 98.7 F (37.1 C)     Temp src --      SpO2 10/29/24 0239 97 %     Weight 10/29/24 0238 145 lb (65.8 kg)     Height 10/29/24 0238 5' 5 (1.651 m)     Head Circumference --      Peak Flow --      Pain Score 10/29/24 0238 8     Pain Loc --      Pain Education --      Exclude from Growth Chart --     Most recent vital signs: Vitals:   10/29/24 0239  BP: (!) 146/119  Pulse: 84  Resp: 18  Temp: 98.7 F (37.1 C)  SpO2: 97%    General: Awake, no distress.  CV:  Good peripheral perfusion.  Regular rate and rhythm  Resp:  Normal effort.  Equal breath sounds bilaterally.  Abd:  No distention.  Soft, minimal  suprapubic tenderness.  ED Results / Procedures / Treatments   RADIOLOGY  Ultrasound shows no gestational sac identified.   MEDICATIONS ORDERED IN ED: Medications - No data to display   IMPRESSION / MDM / ASSESSMENT AND PLAN / ED COURSE  I reviewed the triage vital signs and the nursing notes.  Patient's presentation is most consistent with acute presentation with potential threat to life or bodily function.  Patient presents emergency department for lower abdominal cramping and spotting with a positive pregnancy test.  Will recheck labs today including a quantitative beta-hCG, if positive we will proceed with an ultrasound to further evaluate and help rule out an ectopic pregnancy.  Patient agreeable to plan of care.  Patient's workup today shows a reassuring CBC reassuring chemistry hCG remains around 335 which is largely unchanged since Monday.  Given the patient's complaint of left lower pain with a continued positive beta-hCG although negative ultrasound I spoke to Dr. Leonce of OB/GYN who is currently speaking to the patient regarding options.  Dr. Leonce would like to proceed with methotrexate treatment which  I will order.  he has alrady spoken to the patient in depth regarding the medication as well as breast feeding precautions (stopping for 1 wk).    FINAL CLINICAL IMPRESSION(S) / ED DIAGNOSES   Abdominal pain Vaginal spotting  Note:  This document was prepared using Dragon voice recognition software and may include unintentional dictation errors.   Dorothyann Drivers, MD 10/29/24 [phone]

## 2024-10-29 NOTE — ED Triage Notes (Signed)
 Pt reports lower abd pain and back pain that began 2 days ago but worse tonight and pt began to have some vaginal spotting. Pt reports she had a + pregnancy test at home but has not had an ultrasound. Pt had vaginal delivery aprox 10 months ago.

## 2024-10-29 NOTE — ED Notes (Signed)
 US  at bedside.
